# Patient Record
Sex: Male | Born: 1965 | Race: White | Hispanic: No | Marital: Married | Smoking: Never smoker
Health system: Southern US, Community
[De-identification: ages and names within clinical notes are randomized; demographics above are authoritative.]

## PROBLEM LIST (undated history)

## (undated) DIAGNOSIS — K219 Gastro-esophageal reflux disease without esophagitis: Secondary | ICD-10-CM

## (undated) DIAGNOSIS — E782 Mixed hyperlipidemia: Secondary | ICD-10-CM

## (undated) DIAGNOSIS — R7303 Prediabetes: Secondary | ICD-10-CM

## (undated) DIAGNOSIS — M1A9XX0 Chronic gout, unspecified, without tophus (tophi): Secondary | ICD-10-CM

## (undated) DIAGNOSIS — I10 Essential (primary) hypertension: Secondary | ICD-10-CM

## (undated) DIAGNOSIS — M1A00X0 Idiopathic chronic gout, unspecified site, without tophus (tophi): Secondary | ICD-10-CM

## (undated) DIAGNOSIS — J40 Bronchitis, not specified as acute or chronic: Secondary | ICD-10-CM

## (undated) DIAGNOSIS — M109 Gout, unspecified: Secondary | ICD-10-CM

## (undated) DIAGNOSIS — R739 Hyperglycemia, unspecified: Secondary | ICD-10-CM

## (undated) HISTORY — DX: Gout, unspecified: M10.9

## (undated) HISTORY — DX: Chronic gout, unspecified, without tophus (tophi): M1A.9XX0

## (undated) HISTORY — DX: Hyperglycemia, unspecified: R73.9

## (undated) HISTORY — PX: APPENDECTOMY: SHX54

## (undated) HISTORY — DX: Essential (primary) hypertension: I10

## (undated) HISTORY — DX: Mixed hyperlipidemia: E78.2

## (undated) HISTORY — DX: Idiopathic chronic gout, unspecified site, without tophus (tophi): M1A.00X0

## (undated) HISTORY — PX: KNEE ARTHROSCOPY: SUR90

---

## 1994-05-18 HISTORY — PX: KNEE ARTHROSCOPY: SHX127

## 1996-05-18 HISTORY — PX: KNEE ARTHROSCOPY: SUR90

## 2005-05-18 HISTORY — PX: APPENDECTOMY: SHX54

## 2016-04-29 DIAGNOSIS — M1A00X0 Idiopathic chronic gout, unspecified site, without tophus (tophi): Secondary | ICD-10-CM | POA: Insufficient documentation

## 2016-04-29 DIAGNOSIS — M1A9XX0 Chronic gout, unspecified, without tophus (tophi): Secondary | ICD-10-CM | POA: Insufficient documentation

## 2016-05-18 DIAGNOSIS — K852 Alcohol induced acute pancreatitis without necrosis or infection: Secondary | ICD-10-CM

## 2016-05-18 HISTORY — DX: Alcohol induced acute pancreatitis without necrosis or infection: K85.20

## 2016-09-28 ENCOUNTER — Inpatient Hospital Stay: Payer: BLUE CROSS/BLUE SHIELD

## 2016-09-28 ENCOUNTER — Encounter: Payer: Self-pay | Admitting: Oncology

## 2016-09-28 ENCOUNTER — Inpatient Hospital Stay: Payer: BLUE CROSS/BLUE SHIELD | Attending: Oncology | Admitting: Oncology

## 2016-09-28 VITALS — BP 117/77 | HR 94 | Temp 99.2°F | Resp 18 | Ht 67.0 in | Wt 235.9 lb

## 2016-09-28 DIAGNOSIS — E785 Hyperlipidemia, unspecified: Secondary | ICD-10-CM | POA: Diagnosis not present

## 2016-09-28 DIAGNOSIS — Z79899 Other long term (current) drug therapy: Secondary | ICD-10-CM

## 2016-09-28 DIAGNOSIS — R5383 Other fatigue: Secondary | ICD-10-CM | POA: Diagnosis not present

## 2016-09-28 DIAGNOSIS — F17290 Nicotine dependence, other tobacco product, uncomplicated: Secondary | ICD-10-CM | POA: Insufficient documentation

## 2016-09-28 DIAGNOSIS — D691 Qualitative platelet defects: Secondary | ICD-10-CM | POA: Diagnosis present

## 2016-09-28 DIAGNOSIS — E782 Mixed hyperlipidemia: Secondary | ICD-10-CM | POA: Insufficient documentation

## 2016-09-28 DIAGNOSIS — M25562 Pain in left knee: Secondary | ICD-10-CM

## 2016-09-28 DIAGNOSIS — Z803 Family history of malignant neoplasm of breast: Secondary | ICD-10-CM | POA: Diagnosis not present

## 2016-09-28 DIAGNOSIS — M109 Gout, unspecified: Secondary | ICD-10-CM | POA: Insufficient documentation

## 2016-09-28 DIAGNOSIS — G8929 Other chronic pain: Secondary | ICD-10-CM | POA: Insufficient documentation

## 2016-09-28 DIAGNOSIS — I10 Essential (primary) hypertension: Secondary | ICD-10-CM | POA: Diagnosis not present

## 2016-09-28 DIAGNOSIS — Z7984 Long term (current) use of oral hypoglycemic drugs: Secondary | ICD-10-CM

## 2016-09-28 DIAGNOSIS — R7989 Other specified abnormal findings of blood chemistry: Secondary | ICD-10-CM

## 2016-09-28 DIAGNOSIS — Z808 Family history of malignant neoplasm of other organs or systems: Secondary | ICD-10-CM | POA: Diagnosis not present

## 2016-09-28 LAB — CBC WITH DIFFERENTIAL/PLATELET
BASOS ABS: 0.1 10*3/uL (ref 0–0.1)
Basophils Relative: 1 %
Eosinophils Absolute: 0.1 10*3/uL (ref 0–0.7)
Eosinophils Relative: 1 %
HEMATOCRIT: 39.7 % — AB (ref 40.0–52.0)
HEMOGLOBIN: 13.6 g/dL (ref 13.0–18.0)
LYMPHS ABS: 1.4 10*3/uL (ref 1.0–3.6)
LYMPHS PCT: 14 %
MCH: 31.7 pg (ref 26.0–34.0)
MCHC: 34.2 g/dL (ref 32.0–36.0)
MCV: 92.6 fL (ref 80.0–100.0)
MONOS PCT: 9 %
Monocytes Absolute: 0.9 10*3/uL (ref 0.2–1.0)
NEUTROS PCT: 75 %
Neutro Abs: 7.6 10*3/uL — ABNORMAL HIGH (ref 1.4–6.5)
Platelets: 203 10*3/uL (ref 150–440)
RBC: 4.29 MIL/uL — AB (ref 4.40–5.90)
RDW: 12.9 % (ref 11.5–14.5)
WBC: 10 10*3/uL (ref 3.8–10.6)

## 2016-09-28 NOTE — Progress Notes (Signed)
Hematology/Oncology Consult note [facility] Telephone:([phone] [phone]  Patient Care Team: Leim FabryAldridge, Barbara, MD as PCP - General (Family Medicine)   Name of the patient: Austin Wilson  [id number]  04-20-1966    Reason for referral- giant platelets noted on peripheral smear   Referring physician- Dr. Leim FabryBarbara Aldridge  Date of visit: 09/28/16   History of presenting illness- Patient is a 51 yr old male with PMH significant for htn and gout. He has been referred to us for "large and giant platelets" noted on his peripheral smear. Recent CBC from 09/03/2016 revealed white count of 8.3, H&H of 13.6/38.9 with an MCV of 91 and a platelet count of 209. Peripheral blood smear review revealed large and giant platelets as well as tear drop cells. He was also noted to have variant lymphocytes of 7%. Of note his prior CBC from 2014 until present has always noted to have a normal platelet count between 174 246. He is also had a normal white count and a normal H&H in the past. Differential has shown monocytosis and metamyelocytes in the past as well  Reports feeling fatigued. Denies any appetitie or weight loss. Has chronic left knee pain. Has had prior appendectomy and arthroscopic procedures without significant bleeding. No family h/o blood disorders  ECOG PS- 0  Pain scale-4   Review of systems- Review of Systems  Constitutional: Positive for malaise/fatigue. Negative for chills, fever and weight loss.  HENT: Negative for congestion, ear discharge and nosebleeds.   Eyes: Negative for blurred vision.  Respiratory: Negative for cough, hemoptysis, sputum production, shortness of breath and wheezing.   Cardiovascular: Negative for chest pain, palpitations, orthopnea and claudication.  Gastrointestinal: Negative for abdominal pain, blood in stool, constipation, diarrhea, heartburn, melena, nausea and vomiting.  Genitourinary: Negative for dysuria, flank  pain, frequency, hematuria and urgency.  Musculoskeletal: Positive for joint pain. Negative for back pain and myalgias.  Skin: Negative for rash.  Neurological: Negative for dizziness, tingling, focal weakness, seizures, weakness and headaches.  Endo/Heme/Allergies: Does not bruise/bleed easily.  Psychiatric/Behavioral: Negative for depression and suicidal ideas. The patient does not have insomnia.     Allergies  Allergen Reactions  . Pravastatin Other (See Comments)    Stomach pains    Patient Active Problem List   Diagnosis Date Noted  . Essential hypertension 09/28/2016  . Mixed hyperlipidemia 09/28/2016  . Chronic gouty arthritis 04/29/2016     Past Medical History:  Diagnosis Date  . Chronic gouty arthritis   . Gout   . Hyperglycemia   . Hypertension   . Mixed hyperlipidemia      Past Surgical History:  Procedure Laterality Date  . APPENDECTOMY    . KNEE ARTHROSCOPY     x 2    Social History   Social History  . Marital status: Married    Spouse name: N/A  . Number of children: N/A  . Years of education: N/A   Occupational History  . Not on file.   Social History Main Topics  . Smoking status: Never Smoker  . Smokeless tobacco: Current User    Types: Snuff  . Alcohol use 1.2 oz/week    2 Glasses of wine per week     Comment: every day wine  . Drug use: No  . Sexual activity: Not on file   Other Topics Concern  . Not on file   Social History Narrative  . No narrative on file     Family History  Problem  Relation Age of Onset  . Breast cancer Mother   . Brain cancer Paternal Grandmother      Current Outpatient Prescriptions:  .  allopurinol (ZYLOPRIM) 300 MG tablet, Take by mouth., Disp: , Rfl:  .  atorvastatin (LIPITOR) 40 MG tablet, Take by mouth., Disp: , Rfl:  .  colchicine 0.6 MG tablet, 1 tablet daily along with allopurinol for 30 days, Disp: , Rfl:  .  indomethacin (INDOCIN) 50 MG capsule, TAKE ONE CAPSULE (50 MG TOTAL) BY MOUTH  TWICE DAILY AS NEEDED (DO NOT TAKE WITH ALEVE), Disp: , Rfl:  .  lisinopril (PRINIVIL,ZESTRIL) 20 MG tablet, One po qhs, Disp: , Rfl:  .  lisinopril-hydrochlorothiazide (PRINZIDE,ZESTORETIC) 20-12.5 MG tablet, One po qd in the morning, Disp: , Rfl:  .  metFORMIN (GLUCOPHAGE-XR) 500 MG 24 hr tablet, Take by mouth., Disp: , Rfl:  .  verapamil (VERELAN PM) 240 MG 24 hr capsule, Take by mouth., Disp: , Rfl:  .  naproxen sodium (ANAPROX) 220 MG tablet, Take by mouth., Disp: , Rfl:  .  Omega-3 Fatty Acids (FISH OIL PO), Take by mouth., Disp: , Rfl:    Physical exam:  Vitals:   09/28/16 1349  BP: 117/77  Pulse: 94  Resp: 18  Temp: 99.2 F (37.3 C)  TempSrc: Tympanic  Weight: 235 lb 14.3 oz (107 kg)  Height: 5\' 7"  (1.702 m)   Physical Exam  Constitutional: He is oriented to person, place, and time and well-developed, well-nourished, and in no distress.  HENT:  Head: Normocephalic and atraumatic.  Eyes: EOM are normal. Pupils are equal, round, and reactive to light.  Neck: Normal range of motion.  Cardiovascular: Normal rate, regular rhythm and normal heart sounds.   Pulmonary/Chest: Effort normal and breath sounds normal.  Abdominal: Soft. Bowel sounds are normal.  Neurological: He is alert and oriented to person, place, and time.  Skin: Skin is warm and dry.        Assessment and plan- Patient is a 51 y.o. male referred to [place] for giant platelets noted on peripheral smear with a normal platelet count  On review of his cbc- patient has had giant platelets which is seen when there is increased turnover of platelets in conditions such as ITP with (which the patient does not have given normal platelet count), myeloproliferative disorders (unlikely given normal wbc and H/H) but needs to be ruled out and congenital platelet disorders. Patient has had prior surgeries with no bleeding and in the presence of normal platelet count, this is unlikely.  Given that patient had some left shift on  his differential including monicytosis/ variant lymphocytes in additon to giant platelets- I will obtain cbc with diff, pathology review of his smear and peripheral flow cytometry and bcr abl testing. I will see him back in 3 weeks time to discuss results fo his bloodwork   Thank you for this kind referral and the opportunity to participate in the care of this  Patient   Visit Diagnosis 1. Abnormal morphology of platelets     Dr. Owens Shark, MD, MPH [facility] Pager- [phone] 09/28/2016

## 2016-09-29 LAB — PATHOLOGIST SMEAR REVIEW

## 2016-10-01 ENCOUNTER — Encounter: Payer: Self-pay | Admitting: Family Medicine

## 2016-10-01 LAB — BCR-ABL1, CML/ALL, PCR, QUANT

## 2016-10-05 LAB — COMP PANEL: LEUKEMIA/LYMPHOMA: CLINICAL INFO: 14

## 2016-10-19 ENCOUNTER — Ambulatory Visit: Payer: BLUE CROSS/BLUE SHIELD | Admitting: Oncology

## 2016-10-26 ENCOUNTER — Inpatient Hospital Stay: Payer: BLUE CROSS/BLUE SHIELD | Attending: Oncology | Admitting: Oncology

## 2016-10-26 ENCOUNTER — Encounter: Payer: Self-pay | Admitting: Oncology

## 2016-10-26 VITALS — BP 127/88 | HR 84 | Temp 98.6°F | Resp 18 | Wt 239.5 lb

## 2016-10-26 DIAGNOSIS — I10 Essential (primary) hypertension: Secondary | ICD-10-CM | POA: Diagnosis not present

## 2016-10-26 DIAGNOSIS — E785 Hyperlipidemia, unspecified: Secondary | ICD-10-CM | POA: Insufficient documentation

## 2016-10-26 DIAGNOSIS — Z7984 Long term (current) use of oral hypoglycemic drugs: Secondary | ICD-10-CM | POA: Diagnosis not present

## 2016-10-26 DIAGNOSIS — M25562 Pain in left knee: Secondary | ICD-10-CM | POA: Diagnosis not present

## 2016-10-26 DIAGNOSIS — F17290 Nicotine dependence, other tobacco product, uncomplicated: Secondary | ICD-10-CM | POA: Insufficient documentation

## 2016-10-26 DIAGNOSIS — M109 Gout, unspecified: Secondary | ICD-10-CM | POA: Diagnosis not present

## 2016-10-26 DIAGNOSIS — Z79899 Other long term (current) drug therapy: Secondary | ICD-10-CM | POA: Diagnosis not present

## 2016-10-26 DIAGNOSIS — R799 Abnormal finding of blood chemistry, unspecified: Secondary | ICD-10-CM | POA: Diagnosis not present

## 2016-10-26 DIAGNOSIS — G8929 Other chronic pain: Secondary | ICD-10-CM | POA: Insufficient documentation

## 2016-10-26 NOTE — Progress Notes (Signed)
Hematology/Oncology Consult note [facility]  Telephone:([phone] Fax:([phone]  Patient Care Team: Gayland Curry, MD as PCP - General (Family Medicine)   Name of the patient: Austin Wilson  [id number]  [birth year]   Date of visit: 10/26/16  Diagnosis- giant platelets on peripheral smear.   Chief complaint/ Reason for visit- discuss results of bloodwork  Heme/Onc history: Patient is a 51 yr old male with PMH significant for htn and gout. He has been referred to [place] for "large and giant platelets" noted on his peripheral smear. Recent CBC from 09/03/2016 revealed white count of 8.3, H&H of 13.6/38.9 with an MCV of 91 and a platelet count of 209. Peripheral blood smear review revealed large and giant platelets as well as tear drop cells. He was also noted to have variant lymphocytes of 7%. Of note his prior CBC from 2014 until present has always noted to have a normal platelet count between 174 246. He is also had a normal white count and a normal H&H in the past. Differential has shown monocytosis and metamyelocytes in the past as well  Reports feeling fatigued. Denies any appetitie or weight loss. Has chronic left knee pain. Has had prior appendectomy and arthroscopic procedures without significant bleeding. No family h/o blood disorders   Further blood work from 09/28/2016 was as follows: CBC showed white count of 10.0, H&H of 13.6/29.7 and a platelet count of 203. Review of peripheral blood smear showed normal platelet morphology and rare giant platelets. Unremarkable erythroid platelets and WBC morphology. Peripheral cytometry did not reveal any evidence of lymphoma or leukemia. BCR able testing was negative for CML.  Interval history- has chronic left knee pain in need of replacement. Denies other complaints    Review of systems- Review of Systems  Constitutional: Negative for chills, fever, malaise/fatigue and weight loss.  HENT:  Negative for congestion, ear discharge and nosebleeds.   Eyes: Negative for blurred vision.  Respiratory: Negative for cough, hemoptysis, sputum production, shortness of breath and wheezing.   Cardiovascular: Negative for chest pain, palpitations, orthopnea and claudication.  Gastrointestinal: Negative for abdominal pain, blood in stool, constipation, diarrhea, heartburn, melena, nausea and vomiting.  Genitourinary: Negative for dysuria, flank pain, frequency, hematuria and urgency.  Musculoskeletal: Positive for joint pain. Negative for back pain and myalgias.  Skin: Negative for rash.  Neurological: Negative for dizziness, tingling, focal weakness, seizures, weakness and headaches.  Endo/Heme/Allergies: Does not bruise/bleed easily.  Psychiatric/Behavioral: Negative for depression and suicidal ideas. The patient does not have insomnia.        Allergies  Allergen Reactions  . Pravastatin Other (See Comments)    Stomach pains     Past Medical History:  Diagnosis Date  . Chronic gouty arthritis   . Gout   . Hyperglycemia   . Hypertension   . Mixed hyperlipidemia      Past Surgical History:  Procedure Laterality Date  . APPENDECTOMY    . KNEE ARTHROSCOPY     x 2    Social History   Social History  . Marital status: Married    Spouse name: N/A  . Number of children: N/A  . Years of education: N/A   Occupational History  . Not on file.   Social History Main Topics  . Smoking status: Never Smoker  . Smokeless tobacco: Current User    Types: Snuff  . Alcohol use 1.2 oz/week    2 Glasses of wine per week     Comment:  every day wine  . Drug use: No  . Sexual activity: Not on file   Other Topics Concern  . Not on file   Social History Narrative  . No narrative on file    Family History  Problem Relation Age of Onset  . Breast cancer Mother   . Brain cancer Paternal Grandmother      Current Outpatient Prescriptions:  .  allopurinol (ZYLOPRIM) 300 MG  tablet, Take by mouth., Disp: , Rfl:  .  atorvastatin (LIPITOR) 40 MG tablet, Take by mouth., Disp: , Rfl:  .  colchicine 0.6 MG tablet, 1 tablet daily along with allopurinol for 30 days, Disp: , Rfl:  .  indomethacin (INDOCIN) 50 MG capsule, TAKE ONE CAPSULE (50 MG TOTAL) BY MOUTH TWICE DAILY AS NEEDED (DO NOT TAKE WITH ALEVE), Disp: , Rfl:  .  lisinopril (PRINIVIL,ZESTRIL) 20 MG tablet, One po qhs, Disp: , Rfl:  .  lisinopril-hydrochlorothiazide (PRINZIDE,ZESTORETIC) 20-12.5 MG tablet, One po qd in the morning, Disp: , Rfl:  .  metFORMIN (GLUCOPHAGE-XR) 500 MG 24 hr tablet, Take by mouth., Disp: , Rfl:  .  naproxen sodium (ANAPROX) 220 MG tablet, Take by mouth., Disp: , Rfl:  .  Omega-3 Fatty Acids (FISH OIL PO), Take by mouth., Disp: , Rfl:  .  verapamil (VERELAN PM) 240 MG 24 hr capsule, Take by mouth., Disp: , Rfl:   Physical exam:  Vitals:   10/26/16 1442  BP: 127/88  Pulse: 84  Resp: 18  Temp: 98.6 F (37 C)  TempSrc: Tympanic  Weight: 239 lb 8.5 oz (108.7 kg)   Physical Exam  Constitutional: He is oriented to person, place, and time and well-developed, well-nourished, and in no distress.  HENT:  Head: Normocephalic and atraumatic.  Eyes: EOM are normal. Pupils are equal, round, and reactive to light.  Neck: Normal range of motion.  Cardiovascular: Normal rate, regular rhythm and normal heart sounds.   Pulmonary/Chest: Effort normal and breath sounds normal.  Abdominal: Soft. Bowel sounds are normal.  Neurological: He is alert and oriented to person, place, and time.  Skin: Skin is warm and dry.     No flowsheet data found. CBC Latest Ref Rng & Units 09/28/2016  WBC 3.8 - 10.6 K/uL 10.0  Hemoglobin 13.0 - 18.0 g/dL 13.6  Hematocrit 40.0 - 52.0 % 39.7(L)  Platelets 150 - 440 K/uL 203      Assessment and plan- Patient is a 51 y.o. male referred for abnormal peripheral smear  I discussed the results of bloodwork with the patient. His wbc, H/H and platelet counts are  normal. Peripheral smear showed rarae giant platelets which is not necessarily abnormal. Flow cytometry and bcr abl testing negative. This can be monitored conservatively without the need for bone marrow biopsy at this time  Repeat cbc in 6 montsh and I will see him in 1 year with cbc/diff    Visit Diagnosis 1. Abnormal blood smear      Dr. Randa Evens, MD, MPH [facility] Pager- [phone] 10/26/2016 2:37 PM

## 2016-10-26 NOTE — Progress Notes (Signed)
Here for follow up. Saw rheumatologist recently  -no info per pt. Went r/t gout issues.

## 2016-12-04 ENCOUNTER — Other Ambulatory Visit: Payer: Self-pay | Admitting: Orthopedic Surgery

## 2016-12-04 DIAGNOSIS — G8929 Other chronic pain: Secondary | ICD-10-CM

## 2016-12-04 DIAGNOSIS — M25562 Pain in left knee: Principal | ICD-10-CM

## 2016-12-04 DIAGNOSIS — M2352 Chronic instability of knee, left knee: Secondary | ICD-10-CM

## 2016-12-04 DIAGNOSIS — M1712 Unilateral primary osteoarthritis, left knee: Secondary | ICD-10-CM

## 2016-12-09 ENCOUNTER — Ambulatory Visit: Payer: BLUE CROSS/BLUE SHIELD

## 2016-12-16 DIAGNOSIS — E669 Obesity, unspecified: Secondary | ICD-10-CM | POA: Insufficient documentation

## 2016-12-17 ENCOUNTER — Ambulatory Visit: Payer: BLUE CROSS/BLUE SHIELD

## 2017-04-12 DIAGNOSIS — K852 Alcohol induced acute pancreatitis without necrosis or infection: Secondary | ICD-10-CM | POA: Insufficient documentation

## 2017-04-16 DIAGNOSIS — K760 Fatty (change of) liver, not elsewhere classified: Secondary | ICD-10-CM | POA: Insufficient documentation

## 2017-04-26 ENCOUNTER — Other Ambulatory Visit: Payer: BLUE CROSS/BLUE SHIELD

## 2017-08-13 ENCOUNTER — Other Ambulatory Visit: Payer: Self-pay | Admitting: Unknown Physician Specialty

## 2017-08-13 DIAGNOSIS — M1732 Unilateral post-traumatic osteoarthritis, left knee: Secondary | ICD-10-CM

## 2017-08-13 DIAGNOSIS — E669 Obesity, unspecified: Secondary | ICD-10-CM

## 2017-08-21 ENCOUNTER — Ambulatory Visit: Payer: BLUE CROSS/BLUE SHIELD

## 2017-09-22 DIAGNOSIS — M1712 Unilateral primary osteoarthritis, left knee: Secondary | ICD-10-CM | POA: Insufficient documentation

## 2017-10-04 HISTORY — PX: TOTAL KNEE ARTHROPLASTY: SHX125

## 2017-10-20 ENCOUNTER — Telehealth: Payer: Self-pay | Admitting: *Deleted

## 2017-10-20 NOTE — Telephone Encounter (Signed)
Patient no longer needs appts according to message I received in in basket message from [place].

## 2017-10-20 NOTE — Telephone Encounter (Signed)
-----   Message from Lauretta Grilliana H Toth sent at 10/20/2017 11:39 AM EDT ----- Regarding: cancel appt Pt called and didnt remember appt this June for 1 yr f/up-says to cancel his issue has been resolved. I did CANCEL his appts

## 2017-10-25 ENCOUNTER — Ambulatory Visit: Payer: BLUE CROSS/BLUE SHIELD | Admitting: Oncology

## 2017-10-25 ENCOUNTER — Other Ambulatory Visit: Payer: BLUE CROSS/BLUE SHIELD

## 2018-04-16 DIAGNOSIS — G4733 Obstructive sleep apnea (adult) (pediatric): Secondary | ICD-10-CM | POA: Insufficient documentation

## 2020-09-06 DIAGNOSIS — F5101 Primary insomnia: Secondary | ICD-10-CM | POA: Insufficient documentation

## 2020-10-24 ENCOUNTER — Other Ambulatory Visit: Payer: Self-pay

## 2020-10-24 ENCOUNTER — Ambulatory Visit: Payer: BC Managed Care – PPO | Admitting: Urology

## 2020-10-24 ENCOUNTER — Encounter: Payer: Self-pay | Admitting: Urology

## 2020-10-24 VITALS — BP 174/98 | HR 82 | Ht 67.0 in | Wt 250.0 lb

## 2020-10-24 DIAGNOSIS — E291 Testicular hypofunction: Secondary | ICD-10-CM | POA: Diagnosis not present

## 2020-10-24 DIAGNOSIS — Z125 Encounter for screening for malignant neoplasm of prostate: Secondary | ICD-10-CM

## 2020-10-24 NOTE — Progress Notes (Signed)
10/24/2020 8:17 AM   Austin Wilson 01-Oct-1965 [id number]  Referring provider: Lovell Sheehan, MD [address] [place],  [place] [address]  Chief Complaint  Patient presents with   Hypogonadism    HPI: Austin Wilson is a 55 y.o. male referred for evaluation of hypogonadism.  Complains of significant tiredness/fatigue and low libido Denies erectile dysfunction No prior urologic history No bothersome LUTS Total testosterone level 09/06/2020 was 200 ng/dL and a repeat testosterone level 10/02/2020 was 202 ng/dL PSA 3/66/4403 was 4.74   PMH: Past Medical History:  Diagnosis Date   Chronic gouty arthritis    Gout    Hyperglycemia    Hypertension    Mixed hyperlipidemia     Surgical History: Past Surgical History:  Procedure Laterality Date   APPENDECTOMY     KNEE ARTHROSCOPY     x 2    Home Medications:  Allergies as of 10/24/2020       Reactions   Pravastatin Other (See Comments)   Stomach pains        Medication List        Accurate as of October 24, 2020  8:17 AM. If you have any questions, ask your nurse or doctor.          STOP taking these medications    allopurinol 300 MG tablet Commonly known as: ZYLOPRIM Stopped by: Riki Altes, MD       TAKE these medications    atorvastatin 80 MG tablet Commonly known as: LIPITOR Take 1 tablet by mouth daily. What changed: Another medication with the same name was removed. Continue taking this medication, and follow the directions you see here. Changed by: Riki Altes, MD   colchicine 0.6 MG tablet 1 tablet daily along with allopurinol for 30 days   FISH OIL PO Take by mouth.   indomethacin 50 MG capsule Commonly known as: INDOCIN TAKE ONE CAPSULE (50 MG TOTAL) BY MOUTH TWICE DAILY AS NEEDED (DO NOT TAKE WITH ALEVE)   lisinopril 20 MG tablet Commonly known as: ZESTRIL One po qhs   lisinopril-hydrochlorothiazide 20-12.5 MG tablet Commonly known as: ZESTORETIC One po  qd in the morning   metFORMIN 500 MG 24 hr tablet Commonly known as: GLUCOPHAGE-XR Take by mouth.   naproxen sodium 220 MG tablet Commonly known as: ALEVE Take by mouth.   verapamil 240 MG 24 hr capsule Commonly known as: VERELAN PM Take by mouth.        Allergies:  Allergies  Allergen Reactions   Pravastatin Other (See Comments)    Stomach pains    Family History: Family History  Problem Relation Age of Onset   Breast cancer Mother    Brain cancer Paternal Grandmother     Social History:  reports that he has never smoked. His smokeless tobacco use includes snuff. He reports current alcohol use of about 2.0 standard drinks of alcohol per week. He reports that he does not use drugs.   Physical Exam: BP (!) 174/98   Pulse 82   Ht 5\' 7"  (1.702 m)   Wt 250 lb (113.4 kg)   BMI 39.16 kg/m   Constitutional:  Alert and oriented, No acute distress. HEENT: [place] AT, moist mucus membranes.  Trachea midline, no masses. Cardiovascular: No clubbing, cyanosis, or edema. Respiratory: Normal respiratory effort, no increased work of breathing. GI: Abdomen is soft, nontender, nondistended, no abdominal masses GU: Phallus is without lesions, testes descended bilaterally with estimated volume 20 cc bilaterally.  Spermatic cord/epididymis  palpably normal bilaterally Skin: No rashes, bruises or suspicious lesions. Neurologic: Grossly intact, no focal deficits, moving all 4 extremities. Psychiatric: Normal mood and affect.   Assessment & Plan:    1.  Hypogonadism Symptoms of tiredness, fatigue and decreased libido Potential side effects of testosterone replacement were discussed including stimulation of benign prostatic growth with lower urinary tract symptoms; erythrocytosis; edema; gynecomastia; worsening sleep apnea; venous thromboembolism; testicular atrophy and infertility. Recent studies suggesting an increased incidence of heart attack and stroke in patients taking testosterone  was discussed. He was informed there is conflicting evidence regarding the impact of testosterone therapy on cardiovascular risk. The theoretical risk of growth stimulation of an undetected prostate cancer was also discussed.  He was informed that current evidence does not provide any definitive answers regarding the risks of testosterone therapy on prostate cancer and cardiovascular disease. The need for periodic monitoring of his testosterone level, PSA, hematocrit and DRE was discussed. We discussed the most common TRT therapies including intramuscular injections and topical gels.  He desires injections He has had 2 abnormal testosterone levels however need to check LH, prolactin.  Last PSA 06/2019 and PSA was also ordered   Riki Altes, MD  [facility] [address] [place], [place] [address] [phone]

## 2020-10-25 ENCOUNTER — Telehealth: Payer: Self-pay | Admitting: Urology

## 2020-10-25 ENCOUNTER — Telehealth: Payer: Self-pay | Admitting: Family Medicine

## 2020-10-25 LAB — PROLACTIN: Prolactin: 4.7 ng/mL (ref 4.0–15.2)

## 2020-10-25 LAB — PSA: Prostate Specific Ag, Serum: 0.3 ng/mL (ref 0.0–4.0)

## 2020-10-25 LAB — LUTEINIZING HORMONE: LH: 4.1 m[IU]/mL (ref 1.7–8.6)

## 2020-10-25 MED ORDER — TESTOSTERONE CYPIONATE 200 MG/ML IM SOLN: 200.0000 mg | INTRAMUSCULAR | 0 refills | Status: DC

## 2020-10-25 NOTE — Telephone Encounter (Signed)
Patient notified and will call our office for injection teaching when he has the medication in hand.

## 2020-10-25 NOTE — Telephone Encounter (Signed)
Rx testosterone sent.  Please schedule follow-up office visit with testosterone prior 6 weeks after his initial injection

## 2020-10-25 NOTE — Telephone Encounter (Signed)
-----   Message from Riki Altes, MD sent at 10/25/2020  2:40 PM EDT ----- LH, prolactin and PSA levels were all normal.  He indicated yesterday he would like to start injections as his form of TRT.  We will send in Rx testosterone to his pharmacy and he needs an appointment for injection training.

## 2020-10-30 ENCOUNTER — Telehealth: Payer: Self-pay | Admitting: Urology

## 2020-10-30 NOTE — Telephone Encounter (Signed)
Pt made injection training appt, pt did not get syringes, please send Rx for syringes

## 2020-10-31 ENCOUNTER — Other Ambulatory Visit: Payer: Self-pay | Admitting: *Deleted

## 2020-10-31 MED ORDER — "BD SAFETYGLIDE NEEDLE 18G X 1-1/2"" MISC": 0 refills | Status: DC

## 2020-10-31 MED ORDER — BD LUER-LOK SYRINGE 21G X 1-1/2" 3 ML MISC: 0 refills | Status: DC

## 2020-11-07 ENCOUNTER — Ambulatory Visit: Payer: BC Managed Care – PPO | Admitting: Urology

## 2020-11-12 NOTE — Progress Notes (Signed)
Austin Wilson is a 55 y.o.  male with testosterone deficiency who presents today for instruction on delivering an IM injection of testosterone cypionate.    I instructed the patient to identify the concentration of his testosterone. Testosterone for injection is usually in the form of testosterone cypionate. These liquids come in multiple concentrations, so before giving an injection, it's very important to make sure that his intended dosage takes into account the concentration of the testosterone serum. Usually, testosterone comes in a concentration of either 100 mg/ml or 200 mg/ml.  We typically use the 200 mg/mL in this office.    Using a sterile, 18 G needle and 3 cc syringe, the testosterone cypionate (Lot # [id number], exp. 02/2023 was drawn up for a 1 cc injection.  To draw up the dose, I demonstrated how to first draw air into the syringe equal to the volume of the dosage. Then, wipe the top of the medication bottle with an alcohol wipe, insert the needle through the lid and into the medication, and push the air from your syringe into the bottle. Turn the bottle upside down and draw out the exact dosage of testosterone.  I demonstrated how to aspirate the syringe by hinge the syringe with its needle uncapped and pointing up in front of him.  Looking for air bubbles in the syringe. Flick the side of the syringe to get these bubbles to rise to the top.    When the dosage is bubble-free, I slowly depressed the plunger to force the air at the top of the syringe out stopping when a tiny drop of medication comes out of the tip of the syringe.  I advised him to be certain no air remained in the syringe as injecting air is very dangerous.  Being careful not to squirt or spray a significant portion of the dosage onto the floor.  Preparing the injection site, outer middle third of the vastus lateralis muscle of the right thigh, I took a sterile alcohol pad and wipe the immediate area around where I  intended him to inject.   I then demonstrated how to change the needle from the 18 G to the 21 G needle.  I then gave him the syringe for injecting.  He correctly identified the injection location.  He held the syringe like a dart at a 90-degree angle above the sterile injection site. Quickly plunged it into the flesh. Before depressing the plunger, he drew back on it slightly and no blood was seen.  I advised him that if blood flashed in the syringe, he would need to remove the needle and then select a different location as he was in the vein.  Inject the medication at a steady, controlled pace.  He fully depressed the plunger, slowly pull the needle out. Pressing around the injection site with a sterile cotton swab as he did so - this preventing the emerging needle from pulling on the skin and causing extra pain.  We assessed the needle entry point for bleeding, and applied a sterile Band-Aid and/or cotton swab if needed. Disposed of the used needle and syringe in a proper sharps container.  I advised him to acquire a sharps container for his personal use at home.  I advised him that If, after injection, he experienced redness, swelling, or discomfort beyond that of normal soreness at the site of injection, call our office for an appointment and instructions.  He is to always store his medication at the recommended temperature, and always  check the expiration date on the bottle. If it's expired, don't use it.  Of course, keep all of med's out of reach of children.  Do not change his dose without consulting your provider.  His starting dose will be 1 cc every 2 weeks.  He will return in 6 weeks for a testosterone level.  Explained it is very important to keep laboratory appointments and follow-up appointments as this medication has a tendency to increase hematocrit and hemoglobin which puts an individual at risk for stroke and heart attack.  If he is not compliant with his appointments, we may need to  discontinue his testosterone therapy.  Octavian Godek, PA-C  I spent 15 minutes on the day of the encounter to include pre-visit record review, face-to-face time with the patient, and post-visit ordering of tests.

## 2020-11-13 ENCOUNTER — Other Ambulatory Visit: Payer: Self-pay

## 2020-11-13 ENCOUNTER — Encounter: Payer: Self-pay | Admitting: Urology

## 2020-11-13 ENCOUNTER — Ambulatory Visit: Payer: BC Managed Care – PPO | Admitting: Urology

## 2020-11-13 VITALS — BP 171/83 | HR 84 | Ht 67.0 in | Wt 250.0 lb

## 2020-11-13 DIAGNOSIS — E349 Endocrine disorder, unspecified: Secondary | ICD-10-CM | POA: Diagnosis not present

## 2020-11-13 MED ORDER — TESTOSTERONE CYPIONATE 200 MG/ML IM SOLN: 200.0000 mg | INTRAMUSCULAR | 0 refills | Status: DC

## 2020-12-24 ENCOUNTER — Other Ambulatory Visit: Payer: Self-pay | Admitting: Orthopedic Surgery

## 2020-12-24 DIAGNOSIS — G8929 Other chronic pain: Secondary | ICD-10-CM

## 2020-12-24 DIAGNOSIS — M2391 Unspecified internal derangement of right knee: Secondary | ICD-10-CM

## 2020-12-24 DIAGNOSIS — Z9889 Other specified postprocedural states: Secondary | ICD-10-CM

## 2020-12-24 DIAGNOSIS — S8391XA Sprain of unspecified site of right knee, initial encounter: Secondary | ICD-10-CM

## 2020-12-27 ENCOUNTER — Other Ambulatory Visit: Payer: BC Managed Care – PPO

## 2020-12-27 ENCOUNTER — Other Ambulatory Visit: Payer: Self-pay

## 2020-12-27 DIAGNOSIS — E349 Endocrine disorder, unspecified: Secondary | ICD-10-CM

## 2020-12-28 LAB — TESTOSTERONE: Testosterone: 335 ng/dL (ref 264–916)

## 2021-01-02 DIAGNOSIS — E291 Testicular hypofunction: Secondary | ICD-10-CM

## 2021-01-02 DIAGNOSIS — E349 Endocrine disorder, unspecified: Secondary | ICD-10-CM

## 2021-01-02 NOTE — Telephone Encounter (Signed)
Would you please get him scheduled for a testosterone, HCT and HBG in four weeks?

## 2021-01-16 ENCOUNTER — Other Ambulatory Visit: Payer: Self-pay

## 2021-01-16 ENCOUNTER — Ambulatory Visit
Admission: RE | Admit: 2021-01-16 | Discharge: 2021-01-16 | Disposition: A | Discharge status: Home / Self Care | Payer: BC Managed Care – PPO | Source: Ambulatory Visit | Attending: Orthopedic Surgery | Admitting: Orthopedic Surgery

## 2021-01-16 DIAGNOSIS — M2391 Unspecified internal derangement of right knee: Secondary | ICD-10-CM

## 2021-01-16 DIAGNOSIS — Z9889 Other specified postprocedural states: Secondary | ICD-10-CM

## 2021-01-16 DIAGNOSIS — G8929 Other chronic pain: Secondary | ICD-10-CM

## 2021-01-16 DIAGNOSIS — S8391XA Sprain of unspecified site of right knee, initial encounter: Secondary | ICD-10-CM

## 2021-01-30 ENCOUNTER — Other Ambulatory Visit: Payer: BC Managed Care – PPO

## 2021-01-30 ENCOUNTER — Other Ambulatory Visit: Payer: Self-pay

## 2021-01-30 DIAGNOSIS — E349 Endocrine disorder, unspecified: Secondary | ICD-10-CM

## 2021-01-30 DIAGNOSIS — E291 Testicular hypofunction: Secondary | ICD-10-CM

## 2021-01-31 ENCOUNTER — Other Ambulatory Visit: Payer: Self-pay | Admitting: *Deleted

## 2021-01-31 DIAGNOSIS — E349 Endocrine disorder, unspecified: Secondary | ICD-10-CM

## 2021-01-31 DIAGNOSIS — E291 Testicular hypofunction: Secondary | ICD-10-CM

## 2021-01-31 LAB — HEMOGLOBIN AND HEMATOCRIT, BLOOD
Hematocrit: 49 % (ref 37.5–51.0)
Hemoglobin: 16.5 g/dL (ref 13.0–17.7)

## 2021-01-31 LAB — TESTOSTERONE: Testosterone: 1316 ng/dL — ABNORMAL HIGH (ref 264–916)

## 2021-02-21 ENCOUNTER — Other Ambulatory Visit: Payer: Self-pay | Admitting: Surgery

## 2021-02-24 ENCOUNTER — Encounter
Admission: RE | Admit: 2021-02-24 | Discharge: 2021-02-24 | Disposition: A | Discharge status: Home / Self Care | Payer: BC Managed Care – PPO | Source: Ambulatory Visit | Attending: Surgery | Admitting: Surgery

## 2021-02-24 ENCOUNTER — Other Ambulatory Visit: Payer: Self-pay

## 2021-02-24 DIAGNOSIS — M109 Gout, unspecified: Secondary | ICD-10-CM | POA: Diagnosis not present

## 2021-02-24 DIAGNOSIS — W010XXA Fall on same level from slipping, tripping and stumbling without subsequent striking against object, initial encounter: Secondary | ICD-10-CM | POA: Diagnosis not present

## 2021-02-24 DIAGNOSIS — E785 Hyperlipidemia, unspecified: Secondary | ICD-10-CM | POA: Diagnosis not present

## 2021-02-24 DIAGNOSIS — M1711 Unilateral primary osteoarthritis, right knee: Secondary | ICD-10-CM | POA: Diagnosis not present

## 2021-02-24 DIAGNOSIS — G4733 Obstructive sleep apnea (adult) (pediatric): Secondary | ICD-10-CM | POA: Diagnosis not present

## 2021-02-24 DIAGNOSIS — I10 Essential (primary) hypertension: Secondary | ICD-10-CM | POA: Diagnosis not present

## 2021-02-24 DIAGNOSIS — Z20822 Contact with and (suspected) exposure to covid-19: Secondary | ICD-10-CM | POA: Diagnosis not present

## 2021-02-24 DIAGNOSIS — Z01818 Encounter for other preprocedural examination: Secondary | ICD-10-CM | POA: Diagnosis present

## 2021-02-24 DIAGNOSIS — Z96652 Presence of left artificial knee joint: Secondary | ICD-10-CM | POA: Diagnosis not present

## 2021-02-24 DIAGNOSIS — S83281A Other tear of lateral meniscus, current injury, right knee, initial encounter: Secondary | ICD-10-CM | POA: Diagnosis not present

## 2021-02-24 DIAGNOSIS — I2510 Atherosclerotic heart disease of native coronary artery without angina pectoris: Secondary | ICD-10-CM | POA: Diagnosis not present

## 2021-02-24 DIAGNOSIS — S83231A Complex tear of medial meniscus, current injury, right knee, initial encounter: Secondary | ICD-10-CM | POA: Diagnosis not present

## 2021-02-24 HISTORY — DX: Bronchitis, not specified as acute or chronic: J40

## 2021-02-24 HISTORY — DX: Prediabetes: R73.03

## 2021-02-24 HISTORY — DX: Gastro-esophageal reflux disease without esophagitis: K21.9

## 2021-02-24 LAB — URINALYSIS, ROUTINE W REFLEX MICROSCOPIC
Bilirubin Urine: NEGATIVE
Glucose, UA: NEGATIVE mg/dL
Hgb urine dipstick: NEGATIVE
Ketones, ur: NEGATIVE mg/dL
Leukocytes,Ua: NEGATIVE
Nitrite: NEGATIVE
Protein, ur: NEGATIVE mg/dL
Specific Gravity, Urine: 1.025 (ref 1.005–1.030)
pH: 5 (ref 5.0–8.0)

## 2021-02-24 LAB — COMPREHENSIVE METABOLIC PANEL
ALT: 28 U/L (ref 0–44)
AST: 24 U/L (ref 15–41)
Albumin: 4.3 g/dL (ref 3.5–5.0)
Alkaline Phosphatase: 61 U/L (ref 38–126)
Anion gap: 8 (ref 5–15)
BUN: 17 mg/dL (ref 6–20)
CO2: 29 mmol/L (ref 22–32)
Calcium: 9.5 mg/dL (ref 8.9–10.3)
Chloride: 99 mmol/L (ref 98–111)
Creatinine, Ser: 0.8 mg/dL (ref 0.61–1.24)
GFR, Estimated: >60 mL/min (ref >60)
Glucose, Bld: 130 mg/dL — ABNORMAL HIGH (ref 70–99)
Potassium: 4 mmol/L (ref 3.5–5.1)
Sodium: 136 mmol/L (ref 135–145)
Total Bilirubin: 0.8 mg/dL (ref 0.3–1.2)
Total Protein: 7.2 g/dL (ref 6.5–8.1)

## 2021-02-24 LAB — CBC WITH DIFFERENTIAL/PLATELET
Abs Immature Granulocytes: 0.05 10*3/uL (ref 0.00–0.07)
Basophils Absolute: 0.1 10*3/uL (ref 0.0–0.1)
Basophils Relative: 1 %
Eosinophils Absolute: 0.1 10*3/uL (ref 0.0–0.5)
Eosinophils Relative: 1 %
HCT: 48 % (ref 39.0–52.0)
Hemoglobin: 16.5 g/dL (ref 13.0–17.0)
Immature Granulocytes: 1 %
Lymphocytes Relative: 17 %
Lymphs Abs: 1.7 10*3/uL (ref 0.7–4.0)
MCH: 32.1 pg (ref 26.0–34.0)
MCHC: 34.4 g/dL (ref 30.0–36.0)
MCV: 93.4 fL (ref 80.0–100.0)
Monocytes Absolute: 1.1 10*3/uL — ABNORMAL HIGH (ref 0.1–1.0)
Monocytes Relative: 11 %
Neutro Abs: 7.2 10*3/uL (ref 1.7–7.7)
Neutrophils Relative %: 69 %
Platelets: 201 10*3/uL (ref 150–400)
RBC: 5.14 MIL/uL (ref 4.22–5.81)
RDW: 11.6 % (ref 11.5–15.5)
WBC: 10.2 10*3/uL (ref 4.0–10.5)
nRBC: 0 % (ref 0.0–0.2)

## 2021-02-24 LAB — TYPE AND SCREEN
ABO/RH(D): O POS
Antibody Screen: NEGATIVE

## 2021-02-24 LAB — SURGICAL PCR SCREEN
MRSA, PCR: NEGATIVE
Staphylococcus aureus: NEGATIVE

## 2021-02-24 NOTE — Patient Instructions (Addendum)
Your procedure is scheduled on: Thursday, October 13 Report to the Registration Desk on the 1st floor of the [facility] To find out your arrival time, please call [phone] between 1PM - 3PM on: Wednesday, October 12  REMEMBER: Instructions that are not followed completely may result in serious medical risk, up to and including death; or upon the discretion of your surgeon and anesthesiologist your surgery may need to be rescheduled.  Do not eat food after midnight the night before surgery.  No gum chewing, lozengers or hard candies.  You may however, drink CLEAR liquids up to 2 hours before you are scheduled to arrive for your surgery. Do not drink anything within 2 hours of your scheduled arrival time.  Clear liquids include: - water  - apple juice without pulp - gatorade (not RED, PURPLE, OR BLUE) - black coffee or tea (Do NOT add milk or creamers to the coffee or tea) Do NOT drink anything that is not on this list.  In addition, your doctor has ordered for you to drink the provided  Ensure Pre-Surgery Clear Carbohydrate Drink  Drinking this carbohydrate drink up to two hours before surgery helps to reduce insulin resistance and improve patient outcomes. Please complete drinking 2 hours prior to scheduled arrival time.  TAKE THESE MEDICATIONS THE MORNING OF SURGERY WITH A SIP OF WATER:  Verapamil  Stop metformin 2 days prior to surgery; last day to take metformin is Monday, October 10. Resume AFTER surgery.  One week prior to surgery: Stop Anti-inflammatories (NSAIDS) such as Advil, Aleve, Ibuprofen, Motrin, Naproxen, Naprosyn and Aspirin based products such as Excedrin, Goodys Powder, BC Powder. Stop ANY OVER THE COUNTER supplements until after surgery. You may however, continue to take Tylenol if needed for pain up until the day of surgery.  No Alcohol for 24 hours before or after surgery.  No Smoking including e-cigarettes for 24 hours prior to surgery.  No chewable  tobacco products for at least 6 hours prior to surgery.  No nicotine patches on the day of surgery.  Do not use any "recreational" drugs for at least a week prior to your surgery.  Please be advised that the combination of cocaine and anesthesia may have negative outcomes, up to and including death. If you test positive for cocaine, your surgery will be cancelled.  On the morning of surgery brush your teeth with toothpaste and water, you may rinse your mouth with mouthwash if you wish. Do not swallow any toothpaste or mouthwash.  Use CHG Soap as directed on instruction sheet.  Do not wear jewelry, make-up, hairpins, clips or nail polish.  Do not wear lotions, powders, or perfumes.   Do not shave body from the neck down 48 hours prior to surgery just in case you cut yourself which could leave a site for infection.  Also, freshly shaved skin may become irritated if using the CHG soap.  Contact lenses, hearing aids and dentures may not be worn into surgery.  Do not bring valuables to the hospital. [facility] is not responsible for any missing/lost belongings or valuables.   Bring your Bi-PAP to the hospital with you in case you may have to spend the night.   Notify your doctor if there is any change in your medical condition (cold, fever, infection).  Wear comfortable clothing (specific to your surgery type) to the hospital.  After surgery, you can help prevent lung complications by doing breathing exercises.  Take deep breaths and cough every 1-2 hours.  Your doctor may order a device called an Incentive Spirometer to help you take deep breaths.  If you are being admitted to the hospital overnight, leave your suitcase in the car. After surgery it may be brought to your room.  Please call the Pre-admissions Testing Dept. at [phone] if you have any questions about these instructions.  Surgery Visitation Policy:  Patients undergoing a surgery or procedure may have one family  member or support person with them as long as that person is not COVID-19 positive or experiencing its symptoms.  That person may remain in the waiting area during the procedure and may rotate out with other people.  Inpatient Visitation:    Visiting hours are 7 a.m. to 8 p.m. Up to two visitors ages 16+ are allowed at one time in a patient room. The visitors may rotate out with other people during the day. Visitors must check out when they leave, or other visitors will not be allowed. One designated support person may remain overnight. The visitor must pass COVID-19 screenings, use hand sanitizer when entering and exiting the patient's room and wear a mask at all times, including in the patient's room. Patients must also wear a mask when staff or their visitor are in the room. Masking is required regardless of vaccination status.

## 2021-02-25 LAB — SARS CORONAVIRUS 2 (TAT 6-24 HRS): SARS Coronavirus 2: NEGATIVE

## 2021-02-26 MED ORDER — CHLORHEXIDINE GLUCONATE 0.12 % MT SOLN: 15.0000 mL | Freq: Once | OROMUCOSAL | Status: AC

## 2021-02-26 MED ORDER — FAMOTIDINE 20 MG PO TABS: 20.0000 mg | ORAL_TABLET | Freq: Once | ORAL | Status: AC

## 2021-02-26 MED ORDER — LACTATED RINGERS IV SOLN: INTRAVENOUS | Status: DC

## 2021-02-26 MED ORDER — ORAL CARE MOUTH RINSE: 15.0000 mL | Freq: Once | OROMUCOSAL | Status: AC

## 2021-02-26 MED ORDER — CEFAZOLIN SODIUM-DEXTROSE 2-4 GM/100ML-% IV SOLN
2.0000 g | INTRAVENOUS | Status: AC
  Administered 2021-02-27: 2 g via INTRAVENOUS

## 2021-02-27 ENCOUNTER — Encounter: Payer: Self-pay | Admitting: Surgery

## 2021-02-27 ENCOUNTER — Ambulatory Visit: Payer: BC Managed Care – PPO | Admitting: Anesthesiology

## 2021-02-27 ENCOUNTER — Ambulatory Visit
Admission: RE | Admit: 2021-02-27 | Discharge: 2021-02-27 | Disposition: A | Discharge status: Home / Self Care | Payer: BC Managed Care – PPO | Attending: Surgery | Admitting: Surgery

## 2021-02-27 ENCOUNTER — Ambulatory Visit: Payer: BC Managed Care – PPO | Admitting: Urgent Care

## 2021-02-27 ENCOUNTER — Encounter
Admission: RE | Disposition: A | Discharge status: Home / Self Care | Payer: Self-pay | Source: Home / Self Care | Attending: Surgery

## 2021-02-27 ENCOUNTER — Ambulatory Visit: Payer: BC Managed Care – PPO

## 2021-02-27 ENCOUNTER — Other Ambulatory Visit: Payer: Self-pay

## 2021-02-27 DIAGNOSIS — S83281A Other tear of lateral meniscus, current injury, right knee, initial encounter: Secondary | ICD-10-CM | POA: Insufficient documentation

## 2021-02-27 DIAGNOSIS — S83231A Complex tear of medial meniscus, current injury, right knee, initial encounter: Secondary | ICD-10-CM | POA: Insufficient documentation

## 2021-02-27 DIAGNOSIS — G4733 Obstructive sleep apnea (adult) (pediatric): Secondary | ICD-10-CM | POA: Diagnosis not present

## 2021-02-27 DIAGNOSIS — M1711 Unilateral primary osteoarthritis, right knee: Secondary | ICD-10-CM | POA: Diagnosis not present

## 2021-02-27 DIAGNOSIS — M109 Gout, unspecified: Secondary | ICD-10-CM | POA: Insufficient documentation

## 2021-02-27 DIAGNOSIS — I2510 Atherosclerotic heart disease of native coronary artery without angina pectoris: Secondary | ICD-10-CM | POA: Insufficient documentation

## 2021-02-27 DIAGNOSIS — Z96651 Presence of right artificial knee joint: Secondary | ICD-10-CM

## 2021-02-27 DIAGNOSIS — E785 Hyperlipidemia, unspecified: Secondary | ICD-10-CM | POA: Insufficient documentation

## 2021-02-27 DIAGNOSIS — W010XXA Fall on same level from slipping, tripping and stumbling without subsequent striking against object, initial encounter: Secondary | ICD-10-CM | POA: Insufficient documentation

## 2021-02-27 DIAGNOSIS — I10 Essential (primary) hypertension: Secondary | ICD-10-CM | POA: Insufficient documentation

## 2021-02-27 DIAGNOSIS — Z96652 Presence of left artificial knee joint: Secondary | ICD-10-CM | POA: Diagnosis not present

## 2021-02-27 HISTORY — PX: TOTAL KNEE ARTHROPLASTY: SHX125

## 2021-02-27 LAB — GLUCOSE, CAPILLARY
Glucose-Capillary: 136 mg/dL — ABNORMAL HIGH (ref 70–99)
Glucose-Capillary: 173 mg/dL — ABNORMAL HIGH (ref 70–99)

## 2021-02-27 LAB — ABO/RH: ABO/RH(D): O POS

## 2021-02-27 SURGERY — ARTHROPLASTY, KNEE, TOTAL
Anesthesia: Spinal | Site: Knee | Laterality: Right

## 2021-02-27 MED ORDER — MIDAZOLAM HCL 2 MG/2ML IJ SOLN
INTRAMUSCULAR | Status: AC
  Filled 2021-02-27: qty 2

## 2021-02-27 MED ORDER — METOCLOPRAMIDE HCL 5 MG/ML IJ SOLN: 5.0000 mg | Freq: Three times a day (TID) | INTRAMUSCULAR | Status: DC | PRN

## 2021-02-27 MED ORDER — CHLORHEXIDINE GLUCONATE 0.12 % MT SOLN
OROMUCOSAL | Status: AC
  Administered 2021-02-27: 15 mL via OROMUCOSAL
  Filled 2021-02-27: qty 15

## 2021-02-27 MED ORDER — 0.9 % SODIUM CHLORIDE (POUR BTL) OPTIME
TOPICAL | Status: DC | PRN
  Administered 2021-02-27: 500 mL

## 2021-02-27 MED ORDER — KETOROLAC TROMETHAMINE 30 MG/ML IJ SOLN: 30.0000 mg | Freq: Once | INTRAMUSCULAR | Status: AC

## 2021-02-27 MED ORDER — ONDANSETRON HCL 4 MG/2ML IJ SOLN: 4.0000 mg | Freq: Once | INTRAMUSCULAR | Status: DC | PRN

## 2021-02-27 MED ORDER — PROPOFOL 500 MG/50ML IV EMUL
INTRAVENOUS | Status: DC | PRN
  Administered 2021-02-27: 100 ug/kg/min via INTRAVENOUS
  Administered 2021-02-27: 50 mg via INTRAVENOUS

## 2021-02-27 MED ORDER — SODIUM CHLORIDE 0.9 % BOLUS PEDS
250.0000 mL | Freq: Once | INTRAVENOUS | Status: AC
  Administered 2021-02-27: 250 mL via INTRAVENOUS

## 2021-02-27 MED ORDER — TRANEXAMIC ACID 1000 MG/10ML IV SOLN
INTRAVENOUS | Status: AC
  Filled 2021-02-27: qty 10

## 2021-02-27 MED ORDER — ONDANSETRON HCL 4 MG/2ML IJ SOLN
INTRAMUSCULAR | Status: AC
  Filled 2021-02-27: qty 2

## 2021-02-27 MED ORDER — ACETAMINOPHEN 500 MG PO TABS: 1000.0000 mg | ORAL_TABLET | Freq: Four times a day (QID) | ORAL | Status: DC

## 2021-02-27 MED ORDER — CEFAZOLIN SODIUM-DEXTROSE 2-4 GM/100ML-% IV SOLN
2.0000 g | Freq: Four times a day (QID) | INTRAVENOUS | Status: DC
Start: 2021-02-27 — End: 2021-02-27
  Administered 2021-02-27: 2 g via INTRAVENOUS

## 2021-02-27 MED ORDER — ACETAMINOPHEN 10 MG/ML IV SOLN
INTRAVENOUS | Status: AC
  Filled 2021-02-27: qty 100

## 2021-02-27 MED ORDER — PROPOFOL 1000 MG/100ML IV EMUL
INTRAVENOUS | Status: AC
  Filled 2021-02-27: qty 100

## 2021-02-27 MED ORDER — KETAMINE HCL 50 MG/5ML IJ SOSY
PREFILLED_SYRINGE | INTRAMUSCULAR | Status: AC
  Filled 2021-02-27: qty 5

## 2021-02-27 MED ORDER — OXYCODONE HCL 5 MG PO TABS: 5.0000 mg | ORAL_TABLET | ORAL | Status: DC | PRN

## 2021-02-27 MED ORDER — FENTANYL CITRATE (PF) 100 MCG/2ML IJ SOLN: 25.0000 ug | INTRAMUSCULAR | Status: DC | PRN

## 2021-02-27 MED ORDER — KETOROLAC TROMETHAMINE 30 MG/ML IJ SOLN
INTRAMUSCULAR | Status: AC
  Administered 2021-02-27: 30 mg via INTRAVENOUS
  Filled 2021-02-27: qty 1

## 2021-02-27 MED ORDER — BUPIVACAINE LIPOSOME 1.3 % IJ SUSP
INTRAMUSCULAR | Status: AC
  Filled 2021-02-27: qty 20

## 2021-02-27 MED ORDER — OXYCODONE HCL 5 MG PO TABS
ORAL_TABLET | ORAL | Status: AC
  Administered 2021-02-27: 10 mg via ORAL
  Filled 2021-02-27: qty 2

## 2021-02-27 MED ORDER — ONDANSETRON HCL 4 MG/2ML IJ SOLN: 4.0000 mg | Freq: Four times a day (QID) | INTRAMUSCULAR | Status: DC | PRN

## 2021-02-27 MED ORDER — CEFAZOLIN SODIUM-DEXTROSE 2-4 GM/100ML-% IV SOLN
INTRAVENOUS | Status: AC
  Filled 2021-02-27: qty 100

## 2021-02-27 MED ORDER — TRANEXAMIC ACID 1000 MG/10ML IV SOLN
INTRAVENOUS | Status: DC | PRN
  Administered 2021-02-27: 1000 mg via TOPICAL

## 2021-02-27 MED ORDER — ONDANSETRON HCL 4 MG/2ML IJ SOLN
INTRAMUSCULAR | Status: DC | PRN
  Administered 2021-02-27: 4 mg via INTRAVENOUS

## 2021-02-27 MED ORDER — OXYCODONE HCL 5 MG PO TABS: 5.0000 mg | ORAL_TABLET | ORAL | 0 refills | Status: DC | PRN

## 2021-02-27 MED ORDER — DEXAMETHASONE SODIUM PHOSPHATE 10 MG/ML IJ SOLN
INTRAMUSCULAR | Status: DC | PRN
  Administered 2021-02-27: 10 mg via INTRAVENOUS

## 2021-02-27 MED ORDER — PHENYLEPHRINE HCL-NACL 20-0.9 MG/250ML-% IV SOLN
INTRAVENOUS | Status: DC | PRN
  Administered 2021-02-27: 23.6 ug/min via INTRAVENOUS

## 2021-02-27 MED ORDER — KETAMINE HCL 10 MG/ML IJ SOLN
INTRAMUSCULAR | Status: DC | PRN
  Administered 2021-02-27: 30 mg via INTRAVENOUS
  Administered 2021-02-27: 20 mg via INTRAVENOUS

## 2021-02-27 MED ORDER — PHENYLEPHRINE HCL (PRESSORS) 10 MG/ML IV SOLN
INTRAVENOUS | Status: AC
  Filled 2021-02-27: qty 2

## 2021-02-27 MED ORDER — FENTANYL CITRATE (PF) 100 MCG/2ML IJ SOLN
INTRAMUSCULAR | Status: AC
  Filled 2021-02-27: qty 2

## 2021-02-27 MED ORDER — BUPIVACAINE HCL (PF) 0.5 % IJ SOLN
INTRAMUSCULAR | Status: AC
  Filled 2021-02-27: qty 10

## 2021-02-27 MED ORDER — PROPOFOL 10 MG/ML IV BOLUS
INTRAVENOUS | Status: AC
  Filled 2021-02-27: qty 20

## 2021-02-27 MED ORDER — BUPIVACAINE-EPINEPHRINE (PF) 0.5% -1:200000 IJ SOLN
INTRAMUSCULAR | Status: DC | PRN
  Administered 2021-02-27: 30 mL via PERINEURAL

## 2021-02-27 MED ORDER — BUPIVACAINE HCL (PF) 0.5 % IJ SOLN
INTRAMUSCULAR | Status: DC | PRN
  Administered 2021-02-27: 3 mL

## 2021-02-27 MED ORDER — SODIUM CHLORIDE 0.9 % IR SOLN
Status: DC | PRN
  Administered 2021-02-27: 3000 mL

## 2021-02-27 MED ORDER — SODIUM CHLORIDE 0.9 % IV SOLN: INTRAVENOUS | Status: DC

## 2021-02-27 MED ORDER — FAMOTIDINE 20 MG PO TABS
ORAL_TABLET | ORAL | Status: AC
  Administered 2021-02-27: 20 mg via ORAL
  Filled 2021-02-27: qty 1

## 2021-02-27 MED ORDER — APIXABAN 2.5 MG PO TABS: 2.5000 mg | ORAL_TABLET | Freq: Two times a day (BID) | ORAL | 0 refills | Status: DC

## 2021-02-27 MED ORDER — METOCLOPRAMIDE HCL 10 MG PO TABS: 5.0000 mg | ORAL_TABLET | Freq: Three times a day (TID) | ORAL | Status: DC | PRN

## 2021-02-27 MED ORDER — KETOROLAC TROMETHAMINE 15 MG/ML IJ SOLN: 15.0000 mg | Freq: Four times a day (QID) | INTRAMUSCULAR | Status: DC

## 2021-02-27 MED ORDER — ONDANSETRON HCL 4 MG PO TABS: 4.0000 mg | ORAL_TABLET | Freq: Four times a day (QID) | ORAL | Status: DC | PRN

## 2021-02-27 MED ORDER — ACETAMINOPHEN 10 MG/ML IV SOLN
INTRAVENOUS | Status: DC | PRN
  Administered 2021-02-27: 1000 mg via INTRAVENOUS

## 2021-02-27 MED ORDER — SODIUM CHLORIDE 0.9 % IV SOLN
INTRAVENOUS | Status: DC | PRN
  Administered 2021-02-27: 60 mL

## 2021-02-27 MED ORDER — FENTANYL CITRATE (PF) 100 MCG/2ML IJ SOLN
INTRAMUSCULAR | Status: DC | PRN
  Administered 2021-02-27: 50 ug via INTRAVENOUS

## 2021-02-27 MED ORDER — MIDAZOLAM HCL 5 MG/5ML IJ SOLN
INTRAMUSCULAR | Status: DC | PRN
  Administered 2021-02-27 (×2): 1 mg via INTRAVENOUS

## 2021-02-27 MED ORDER — DEXAMETHASONE SODIUM PHOSPHATE 10 MG/ML IJ SOLN
INTRAMUSCULAR | Status: AC
  Filled 2021-02-27: qty 1

## 2021-02-27 MED ORDER — SODIUM CHLORIDE FLUSH 0.9 % IV SOLN
INTRAVENOUS | Status: AC
  Filled 2021-02-27: qty 40

## 2021-02-27 MED ORDER — BUPIVACAINE-EPINEPHRINE (PF) 0.5% -1:200000 IJ SOLN
INTRAMUSCULAR | Status: AC
  Filled 2021-02-27: qty 30

## 2021-02-27 SURGICAL SUPPLY — 62 items
BIT DRILL QUICK REL 1/8 2PK SL (DRILL) ×1 IMPLANT
BLADE SAW SAG 25X90X1.19 (BLADE) ×2 IMPLANT
BLADE SURG SZ20 CARB STEEL (BLADE) ×2 IMPLANT
BNDG ELASTIC 6X5.8 VLCR NS LF (GAUZE/BANDAGES/DRESSINGS) ×2 IMPLANT
BNDG ELASTIC 6X5.8 VLCR STR LF (GAUZE/BANDAGES/DRESSINGS) ×2 IMPLANT
CEMENT BONE R 1X40 (Cement) ×4 IMPLANT
CEMENT VACUUM MIXING SYSTEM (MISCELLANEOUS) ×2 IMPLANT
CHLORAPREP W/TINT 26 (MISCELLANEOUS) ×4 IMPLANT
COMPONENT PATELLAR VGD 7.8X3 (Joint) ×2 IMPLANT
COOLER POLAR GLACIER W/PUMP (MISCELLANEOUS) ×2 IMPLANT
COVER MAYO STAND REUSABLE (DRAPES) ×2 IMPLANT
CUFF TOURN SGL QUICK 24 (TOURNIQUET CUFF)
CUFF TOURN SGL QUICK 34 (TOURNIQUET CUFF)
CUFF TRNQT CYL 24X4X16.5-23 (TOURNIQUET CUFF) IMPLANT
CUFF TRNQT CYL 34X4.125X (TOURNIQUET CUFF) IMPLANT
DRAPE 3/4 80X56 (DRAPES) ×2 IMPLANT
DRAPE IMP U-DRAPE 54X76 (DRAPES) ×2 IMPLANT
DRAPE INCISE 23X17 IOBAN STRL (DRAPES) ×1
DRAPE INCISE IOBAN 23X17 STRL (DRAPES) ×1 IMPLANT
DRAPE INCISE IOBAN 66X45 STRL (DRAPES) ×2 IMPLANT
DRILL QUICK RELEASE 1/8 INCH (DRILL) ×1
DRSG MEPILEX SACRM 8.7X9.8 (GAUZE/BANDAGES/DRESSINGS) ×2 IMPLANT
DRSG OPSITE POSTOP 4X10 (GAUZE/BANDAGES/DRESSINGS) IMPLANT
DRSG OPSITE POSTOP 4X8 (GAUZE/BANDAGES/DRESSINGS) ×2 IMPLANT
ELECT REM PT RETURN 9FT ADLT (ELECTROSURGICAL) ×2
ELECTRODE REM PT RTRN 9FT ADLT (ELECTROSURGICAL) ×1 IMPLANT
FEMORAL COMPONENT 70MM RIGHT (Joint) ×2 IMPLANT
GAUZE 4X4 16PLY ~~LOC~~+RFID DBL (SPONGE) ×2 IMPLANT
GAUZE XEROFORM 1X8 LF (GAUZE/BANDAGES/DRESSINGS) ×2 IMPLANT
GLOVE SRG 8 PF TXTR STRL LF DI (GLOVE) ×1 IMPLANT
GLOVE SURG ENC MOIS LTX SZ7.5 (GLOVE) ×8 IMPLANT
GLOVE SURG ENC MOIS LTX SZ8 (GLOVE) ×8 IMPLANT
GLOVE SURG UNDER LTX SZ8 (GLOVE) ×2 IMPLANT
GLOVE SURG UNDER POLY LF SZ8 (GLOVE) ×1
GOWN STRL REUS W/ TWL LRG LVL3 (GOWN DISPOSABLE) ×1 IMPLANT
GOWN STRL REUS W/ TWL XL LVL3 (GOWN DISPOSABLE) ×1 IMPLANT
GOWN STRL REUS W/TWL LRG LVL3 (GOWN DISPOSABLE) ×1
GOWN STRL REUS W/TWL XL LVL3 (GOWN DISPOSABLE) ×1
INSERT TIB BEARING 75 10 (Insert) ×2 IMPLANT
IV NS IRRIG 3000ML ARTHROMATIC (IV SOLUTION) ×2 IMPLANT
KIT TURNOVER KIT A (KITS) ×2 IMPLANT
MANIFOLD NEPTUNE II (INSTRUMENTS) ×2 IMPLANT
NEEDLE SPNL 20GX3.5 QUINCKE YW (NEEDLE) ×2 IMPLANT
NS IRRIG 1000ML POUR BTL (IV SOLUTION) ×2 IMPLANT
PACK TOTAL KNEE (MISCELLANEOUS) ×2 IMPLANT
PAD WRAPON POLAR KNEE (MISCELLANEOUS) ×1 IMPLANT
PLATE KNEE TIBIAL 75MM FIXED (Plate) ×2 IMPLANT
PULSAVAC PLUS IRRIG FAN TIP (DISPOSABLE) ×2
SPONGE T-LAP 18X18 ~~LOC~~+RFID (SPONGE) ×6 IMPLANT
STAPLER SKIN PROX 35W (STAPLE) ×2 IMPLANT
SUCTION FRAZIER HANDLE 10FR (MISCELLANEOUS) ×1
SUCTION TUBE FRAZIER 10FR DISP (MISCELLANEOUS) ×1 IMPLANT
SUT VIC AB 0 CT1 36 (SUTURE) ×6 IMPLANT
SUT VIC AB 2-0 CT1 27 (SUTURE) ×3
SUT VIC AB 2-0 CT1 TAPERPNT 27 (SUTURE) ×3 IMPLANT
SYR 10ML LL (SYRINGE) ×2 IMPLANT
SYR 20ML LL LF (SYRINGE) ×2 IMPLANT
SYR 30ML LL (SYRINGE) IMPLANT
TIP FAN IRRIG PULSAVAC PLUS (DISPOSABLE) ×1 IMPLANT
TRAP FLUID SMOKE EVACUATOR (MISCELLANEOUS) ×2 IMPLANT
WATER STERILE IRR 500ML POUR (IV SOLUTION) ×2 IMPLANT
WRAPON POLAR PAD KNEE (MISCELLANEOUS) ×2

## 2021-02-27 NOTE — H&P (Signed)
History of Present Illness: Austin Wilson is a 55 y.o.male who is being referred by Austin Skeens, PA-C, for right knee pain. The symptoms began 6 to 7 months ago and developed following an injury in which his right leg buckled on him after getting off of his lawnmower and causing him to fall onto his flexed knee. His symptoms worsened in June, 2022, when he apparently slipped and fell while trying to get up onto his tractor, at which time he actually heard a pop in his knee. Because of continued symptoms, he saw Austin Skeens, PA-C. Concerned that he may have torn his meniscus, the patient was sent for an MRI scan and referred to me for further evaluation and treatment. He reports 3/10 pain. The pain is located along the lateral and medial aspect of the knee. The pain is described as aching, dull, stabbing and throbbing. The symptoms are aggravated with normal daily activities, with sleeping, using stairs, at higher levels of activity, walking, standing and standing pivot. He also describes mechanical symptoms. He has associated swelling and no deformity. He has tried over-the-counter medications, anti-inflammatories, ice and heat with temporary partial relief. He denies any numbness or paresthesias down to his foot. He notes that he did have a right knee arthroscopic procedure performed in 1998 from which he has done quite well. He also is status post a left total knee arthroplasty by Dr. Gavin Wilson several years ago from which he has done quite well.  Current Outpatient Medications:  atorvastatin (LIPITOR) 80 MG tablet Take 1 tablet (80 mg total) by mouth once daily 90 tablet 3   BD INTEGRA SYRINGE 3 mL 21 gauge x 1 1/2" Syrg USE THIS NEEDLE TO INJECTION   BD REGULAR BEVEL NEEDLES 18 gauge x 1" Ndle USE TO PULL UP MEDICATION AS DIRECTED   indomethacin (INDOCIN) 50 MG capsule TAKE ONE CAPSULE (50 MG TOTAL) BY MOUTH TWICE DAILY AS NEEDED (DO NOT TAKE WITH ALEVE) 30 capsule 1   lisinopriL (ZESTRIL) 20 MG tablet  Take 1 tablet (20 mg total) by mouth 2 (two) times daily for 90 days 180 tablet 3   metFORMIN (GLUCOPHAGE-XR) 500 MG XR tablet Take 1 tablet (500 mg total) by mouth daily with dinner 90 tablet 3   MITIGARE 0.6 mg capsule Take 2 capsules at onset of gout flare. Take one additional capsule 1 hour later if symptoms persist. 6 capsule 1   multivitamin with iron (CENTRUM ADULT COMPLETE) tablet Take 1 tablet by mouth once daily   testosterone cypionate (DEPO-TESTOSTERONE) 200 mg/mL injection Inject 200 mg into the muscle every 14 (fourteen) days   traZODone (DESYREL) 50 MG tablet Take 1 tablet (50 mg total) by mouth nightly 30 tablet 11   verapamiL (VERELAN) 240 MG SR capsule Take 1 capsule (240 mg total) by mouth nightly for 90 days 90 capsule 3   verapamiL (VERELAN) 360 MG SR capsule   Allergies:   Pravachol [Pravastatin] Other (Stomach pains)   Past Medical History:   Alcohol-induced acute pancreatitis 04/16/2017 (hospitalized)   Alcohol-induced acute pancreatitis without infection or necrosis (06/12/2016)   Chronic gouty arthritis 04/29/2016   Gout 09/30/2012   Hyperglycemia 05/29/2014   Hyperlipidemia   Hypertension   Osteoarthritis   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   Past Surgical History:   APPENDECTOMY   TKA Left 10/04/2017 (left total makoplasty-- Dr Austin Wilson)   KNEE ARTHROSCOPY (bilat knees- track, wrestler, foot, etc.- bilat acl tears)   Family History  Problem Relation  Age of Onset   Breast cancer Mother   High blood pressure (Hypertension) Mother   Coronary Artery Disease (Blocked arteries around heart) Father   Hyperlipidemia (Elevated cholesterol) Father   High blood pressure (Hypertension) Father   Brain cancer Maternal Grandmother   Social History:   Socioeconomic History:   Marital status: Married  Tobacco Use   Smoking status: Never Smoker   Smokeless tobacco: Current User  Types: Snuff  Vaping Use   Vaping Use: Never used   Substance and Sexual Activity   Alcohol use: Yes  Alcohol/week: 10.0 standard drinks  Types: 10 Glasses of wine per week  Comment: less now that he had a pancreatic flare last year   Drug use: No   Sexual activity: Yes  Partners: Female  Birth control/protection: None  Social History Narrative  Married to Austin Wilson ( no children from any marriage)- 3rd marriage for both of the couple- Austin Wilson, 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   Review of Systems:  A comprehensive 14 point ROS was performed, reviewed, and the pertinent orthopaedic findings are documented in the HPI.  Physical Exam: Vitals:  02/14/21 0906  BP: (!) 142/88  Weight: (!) 119.4 kg (263 lb 3.2 oz)  Height: 170.2 cm (5\' 7" )  PainSc: 3  PainLoc: Knee   General/Constitutional: Pleasant significantly overweight middle-aged male in no acute distress. Neuro/Psych: Normal mood and affect, oriented to person, place and time. Eyes: Non-icteric. Pupils are equal, round, and reactive to light, and exhibit synchronous movement. Lymphatic: No palpable adenopathy. Respiratory: Lungs clear to auscultation, Normal chest excursion, No wheezes and Non-labored breathing Cardiovascular: Regular rate and rhythm. No murmurs. and No edema, swelling or tenderness, except as noted in detailed exam. Vascular: No edema, swelling or tenderness, except as noted in detailed exam. Integumentary: No impressive skin lesions present, except as noted in detailed exam. Musculoskeletal: Unremarkable, except as noted in detailed exam.  Right knee exam: GAIT: mild limp and uses no assistive devices. ALIGNMENT: mild varus SKIN: unremarkable SWELLING: mild EFFUSION: trace WARMTH: no warmth TENDERNESS: moderate over the lateral joint line and medial joint line ROM: 0 to 105 degrees with pain in maximal flexion McMURRAY'S: positive PATELLOFEMORAL: normal  tracking with no peri-patellar tenderness and negative apprehension sign CREPITUS: Mild patellofemoral crepitance LACHMAN'S: negative PIVOT SHIFT: negative ANTERIOR DRAWER: negative POSTERIOR DRAWER: negative VARUS/VALGUS: stable  He is neurovascularly intact to the right lower extremity and foot.  Knee Imaging: Recent AP weightbearing of both knees, as well as lateral and merchant views of the right knee are available for review and have been reviewed by myself.. These films demonstrate moderate degenerative changes, primarily involving the medial compartment with 60% medial joint space narrowing. Overall alignment is mild varus. No fractures, lytic lesions, or abnormal calcifications are noted.  Knee Imaging, external: Right knee: A recent MRI scan of the right knee is available for review. By report, the study demonstrates evidence of complex tearing of the posterior portions of the medial and lateral menisci. There is significant "mucoid degeneration" of both the ACL and PCL without partial or full-thickness tears. The lateral collateral ligaments are in satisfactory condition. He also has "moderate to advanced degenerative chondrosis with areas of full or near full-thickness cartilage loss" involving the medial compartment and moderate degenerative changes of the lateral and patellofemoral compartments. Both the films and report reviewed by myself and discussed with the patient and his  wife.  Assessment:   Primary osteoarthritis of right knee Yes   Complex tear of lateral meniscus of right knee as current injury, initial encounter   Complex tear of medial meniscus of right knee as current injury, initial encounter   Plan: The treatment options were discussed with the patient and his wife. In addition, patient educational materials were provided regarding the diagnosis and treatment options. The patient is quite frustrated by his symptoms and functional limitations, and is ready to consider  more aggressive treatment options. After discussing an arthroscopic debridement with partial medial and lateral meniscectomies versus a total knee arthroplasty, the patient has elected to proceed with a right total knee arthroplasty. The procedure was discussed with the patient, as were the potential risks (including bleeding, infection, nerve and/or blood vessel injury, persistent or recurrent pain, loosening and/or failure of the components, dislocation, need for further surgery, blood clots, strokes, heart attacks and/or arhythmias, pneumonia, etc.) and benefits. The patient states his/her understanding and wishes to proceed. All of the patient's questions and concerns were answered. He can call any time with further concerns. He will return to work without restrictions. He will follow up post-surgery, routine.   H&P reviewed and patient re-examined. No changes.

## 2021-02-27 NOTE — Discharge Instructions (Addendum)
Orthopedic discharge instructions: May shower with intact OpSite dressing. Apply ice frequently to knee or use Polar Care. Start Eliquis 2.5 mg twice daily for 2 weeks on Friday, 02/28/2021, then take aspirin 325 mg daily for 4 weeks. Take oxycodone as prescribed when needed.  May supplement with ES Tylenol if necessary. May weight-bear as tolerated on right leg - use walker or crutches for balance and support. Follow-up in 10-14 days or as scheduled.   AMBULATORY SURGERY  DISCHARGE INSTRUCTIONS   The drugs that you were given will stay in your system until tomorrow so for the next 24 hours you should not:  Drive an automobile Make any legal decisions Drink any alcoholic beverage   You may resume regular meals tomorrow.  Today it is better to start with liquids and gradually work up to solid foods.  You may eat anything you prefer, but it is better to start with liquids, then soup and crackers, and gradually work up to solid foods.   Please notify your doctor immediately if you have any unusual bleeding, trouble breathing, redness and pain at the surgery site, drainage, fever, or pain not relieved by medication.    Additional Instructions:   Please contact your physician with any problems or Same Day Surgery at [phone], Monday through Friday 6 am to 4 pm, or [place] at [facility] number at [phone].

## 2021-02-27 NOTE — Anesthesia Procedure Notes (Addendum)
Spinal  Patient location during procedure: OR End time: 02/27/2021 7:50 AM Reason for block: surgical anesthesia Staffing Performed: anesthesiologist and resident/CRNA  Anesthesiologist: Yevette Edwards, MD Resident/CRNA: Reece Agar, CRNA Preanesthetic Checklist Completed: patient identified, IV checked, site marked, risks and benefits discussed, surgical consent, monitors and equipment checked, pre-op evaluation and timeout performed Spinal Block Patient position: sitting Prep: DuraPrep Patient monitoring: heart rate, cardiac monitor, continuous pulse ox and blood pressure Approach: midline Location: L4-5 Injection technique: single-shot Needle Needle type: Sprotte and Whitacre  Needle gauge: 24 G Needle length: 9 cm Assessment Sensory level: T4 Events: CSF return Additional Notes 3 attempts necessary to achieve sub arach. Space but tolerated well and no paresthesia. Clear csf and free flow tolerated well. ja

## 2021-02-27 NOTE — Op Note (Signed)
02/27/2021  9:58 AM  Patient:   Austin Wilson  Pre-Op Diagnosis:   Degenerative joint disease, right knee.  Post-Op Diagnosis:   Same  Procedure:   Right TKA using all-cemented Biomet Vanguard system with a 70 mm PCR femur, a 75 mm tibial tray with a 10 mm E-poly insert, and a 34 x 7.8 mm all-poly 3-pegged domed patella.  Surgeon:   Maryagnes Amos, MD  Assistant:   Horris Latino, PA-C   Anesthesia:   Spinal  Findings:   As above  Complications:   None  EBL:   10 cc  Fluids:   900 cc crystalloid  UOP:   None  TT:   90 minutes at 300 mmHg  Drains:   None  Closure:   Staples  Implants:   As above  Brief Clinical Note:   The patient is a 55 year old male with a history of progressively worsening right knee pain. The patient's symptoms have progressed despite medications, activity modification, injections, etc. The patient's history and examination were consistent with advanced degenerative joint disease of the right knee confirmed by plain radiographs. The patient presents at this time for a right total knee arthroplasty.  Procedure:   The patient was brought into the operating room. After adequate spinal anesthesia was obtained, the patient was lain in the supine position before the right lower extremity was prepped with ChloraPrep solution and draped sterilely. Preoperative antibiotics were administered. After verifying the proper laterality with a surgical timeout, the limb was exsanguinated with an Esmarch and the tourniquet inflated to 300 mmHg.   A standard anterior approach to the knee was made through an approximately 7 inch incision. The incision was carried down through the subcutaneous tissues to expose superficial retinaculum. This was split the length of the incision and the medial flap elevated sufficiently to expose the medial retinaculum. The medial retinaculum was incised, leaving a 3-4 mm cuff of tissue on the patella. This was extended distally along the  medial border of the patellar tendon and proximally through the medial third of the quadriceps tendon. A subtotal fat pad excision was performed before the soft tissues were elevated off the anteromedial and anterolateral aspects of the proximal tibia to the level of the collateral ligaments. The anterior portions of the medial and lateral menisci were removed, as was the anterior cruciate ligament. With the knee flexed to 90, the external tibial guide was positioned and the appropriate proximal tibial cut made. This piece was taken to the back table where it was measured and found to be optimally replicated by a 75 mm component.  Attention was directed to the distal femur. The intramedullary canal was accessed through a 3/8" drill hole. The intramedullary guide was inserted and positioned in order to obtain a neutral flexion gap. The intercondylar block was positioned with care taken to avoid notching the anterior cortex of the femur. The appropriate cut was made. Next, the distal cutting block was placed at 5 of valgus alignment. Using the 9 mm slot, the distal cut was made. The distal femur was measured and found to be optimally replicated by the 70 mm component. The 70 mm 4-in-1 cutting block was positioned and first the posterior, then the posterior chamfer, the anterior chamfer, and finally the anterior cuts were made. At this point, the posterior portions medial and lateral menisci were removed. A trial reduction was performed using the appropriate femoral and tibial components with the 10 mm insert. This demonstrated excellent stability to varus and  valgus stressing both in flexion and extension while permitting full extension. Patella tracking was assessed and found to be excellent. Therefore, the tibial guide position was marked on the proximal tibia. The patella thickness was measured and found to be 22 mm. Therefore, the appropriate cut was made. The patellar surface was measured and found to be  optimally replicated by the 34 mm component. The three peg holes were drilled in place before the trial button was inserted. Patella tracking was assessed and found to be excellent, passing the "no thumb test". The lug holes were drilled into the distal femur before the trial component was removed, leaving only the tibial tray. The keel was then created using the appropriate tower, reamer, and punch.  The bony surfaces were prepared for cementing by irrigating them thoroughly with sterile saline solution via the jet lavage system. A bone plug was fashioned from some of the bone that had been removed previously and used to plug the distal femoral canal. In addition, 20 cc of Exparel diluted out to 60 cc with normal saline and 30 cc of 0.5% Sensorcaine were injected into the postero-medial and postero-lateral aspects of the knee, the medial and lateral gutter regions, and the peri-incisional tissues to help with postoperative analgesia. Meanwhile, the cement was being mixed on the back table. When it was ready, the tibial tray was cemented in first. The excess cement was removed using Personal assistant. Next, the femoral component was impacted into place. Again, the excess cement was removed using Personal assistant. The 10 mm trial insert was positioned and the knee brought into extension while the cement hardened. Finally, the patella was cemented into place and secured using the patellar clamp. Again, the excess cement was removed using Personal assistant. Once the cement had hardened, the knee was placed through a range of motion with the findings as described above. Therefore, the trial insert was removed and, after verifying that no cement had been retained posteriorly, the permanent 10 mm anterior stabilized E-polyethylene insert was positioned and secured using the appropriate key locking mechanism. Again the knee was placed through a range of motion with the findings as described above.  The wound was copiously  irrigated with sterile saline solution using the jet lavage system before the quadriceps tendon and retinacular layer were reapproximated using #0 Vicryl interrupted sutures. The superficial retinacular layer also was closed using a running #0 Vicryl suture. A total of 10 cc of transexemic acid (TXA) was injected intra-articularly before the subcutaneous tissues were closed in several layers using 2-0 Vicryl interrupted sutures. The skin was closed using staples. A sterile honeycomb dressing was applied to the skin before the leg was wrapped with an Ace wrap to accommodate the Polar Care device. The patient was then awakened and returned to the recovery room in satisfactory condition after tolerating the procedure well.

## 2021-02-27 NOTE — Transfer of Care (Signed)
Immediate Anesthesia Transfer of Care Note  Patient: Austin Wilson  Procedure(s) Performed: TOTAL KNEE ARTHROPLASTY (Right: Knee)  Patient Location: PACU  Anesthesia Type:Spinal  Level of Consciousness: awake, alert , oriented and patient cooperative  Airway & Oxygen Therapy: Patient Spontanous Breathing and Patient connected to face mask oxygen  Post-op Assessment: Report given to RN and Post -op Vital signs reviewed and stable  Post vital signs: Reviewed and stable  Last Vitals:  Vitals Value Taken Time  BP 133/90 02/27/21 1002  Temp    Pulse 70 02/27/21 1007  Resp 16 02/27/21 1007  SpO2 98 % 02/27/21 1007  Vitals shown include unvalidated device data.  Last Pain:  Vitals:   02/27/21 7209  TempSrc: Oral  PainSc: 0-No pain         Complications: No notable events documented.

## 2021-02-27 NOTE — Anesthesia Preprocedure Evaluation (Signed)
Anesthesia Evaluation  Patient identified by MRN, date of birth, ID band Patient awake    Reviewed: Allergy & Precautions, H&P , NPO status , Patient's Chart, lab work & pertinent test results, reviewed documented beta blocker date and time   Airway Mallampati: III   Neck ROM: full    Dental  (+) Teeth Intact   Pulmonary sleep apnea and Continuous Positive Airway Pressure Ventilation ,    Pulmonary exam normal        Cardiovascular Exercise Tolerance: Good hypertension, On Medications negative cardio ROS Normal cardiovascular exam Rhythm:regular Rate:Normal     Neuro/Psych negative neurological ROS  negative psych ROS   GI/Hepatic Neg liver ROS, GERD  Medicated,  Endo/Other  negative endocrine ROS  Renal/GU negative Renal ROS  negative genitourinary   Musculoskeletal   Abdominal   Peds  Hematology negative hematology ROS (+)   Anesthesia Other Findings Past Medical History: 2018: Alcohol-induced acute pancreatitis No date: Bronchitis No date: Chronic gouty arthritis No date: GERD (gastroesophageal reflux disease) No date: Gout No date: Hyperglycemia No date: Hypertension No date: Mixed hyperlipidemia No date: Pre-diabetes Past Surgical History: 2007: APPENDECTOMY 1998: KNEE ARTHROSCOPY; Right     Comment:  torn lateral meniscus, ACL 1996: KNEE ARTHROSCOPY; Left     Comment:  torn ACL, lateral meniscus 10/04/2017: TOTAL KNEE ARTHROPLASTY; Left BMI    Body Mass Index: 41.04 kg/m     Reproductive/Obstetrics negative OB ROS                             Anesthesia Physical Anesthesia Plan  ASA: 3  Anesthesia Plan: Spinal   Post-op Pain Management:    Induction:   PONV Risk Score and Plan: 2  Airway Management Planned:   Additional Equipment:   Intra-op Plan:   Post-operative Plan:   Informed Consent: I have reviewed the patients History and Physical, chart, labs  and discussed the procedure including the risks, benefits and alternatives for the proposed anesthesia with the patient or authorized representative who has indicated his/her understanding and acceptance.     Dental Advisory Given  Plan Discussed with: CRNA  Anesthesia Plan Comments:         Anesthesia Quick Evaluation

## 2021-02-27 NOTE — Evaluation (Signed)
Physical Therapy Evaluation Patient Details Name: Austin Wilson MRN: [id number] DOB: 10-22-1965 Today's Date: 02/27/2021  History of Present Illness  Pt is a 55 y.o. 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  PMH includes pancreatitis, gout, HLD, htn, OA, s/p L TKA 2019, sleep apnea.  Clinical Impression  Prior to surgery, pt was independent and active; lives with his wife in 1 level home with 4 STE.  Currently pt is modified independent semi-supine to sitting edge of bed; SBA with transfers using RW; CGA progressing to SBA with ambulation 140 feet with RW; and CGA navigating 4 steps with L railing.  Minimal R knee pain reported throughout PT session.  Able to perform R LE SLR independently.  R knee AROM 0-90 degrees.  Pt would benefit from skilled PT to address noted impairments and functional limitations (see below for any additional details).  Upon hospital discharge, pt would benefit from HHPT (pt reports having this set up already and coming this Sunday).      Recommendations for follow up therapy are one component of a multi-disciplinary discharge planning process, led by the attending physician.  Recommendations may be updated based on patient status, additional functional criteria and insurance authorization.  Follow Up Recommendations Home health PT (pt reports HHPT set up already and coming this Sunday)    Equipment Recommendations  Other (comment) (pt has needed DME at home already ([facility] and [facility]))    Recommendations for Other Services       Precautions / Restrictions Precautions Precautions: Fall;Knee Precaution Booklet Issued: Yes (comment) Restrictions Weight Bearing Restrictions: Yes RLE Weight Bearing: Weight bearing as tolerated Other Position/Activity Restrictions: May WBAT on R leg using walker for balance and support      Mobility  Bed Mobility Overal bed mobility: Modified Independent (HOB of stretcher mildly elevated)             General bed mobility  comments: Semi-supine to sitting without any noted difficulty    Transfers Overall transfer level: Needs assistance Equipment used: Rolling walker (2 wheeled) Transfers: Sit to/from [employer] Sit to Stand: Supervision Stand pivot transfers: Supervision       General transfer comment: initial vc's for UE placement  Ambulation/Gait Ambulation/Gait assistance: Min guard;Supervision Gait Distance (Feet): 140 Feet Assistive device: Rolling walker (2 wheeled) Gait Pattern/deviations: Antalgic Gait velocity: mildly decreased   General Gait Details: mild decreased stance time R LE; partial progressing to step through gait pattern; mild R knee flexion during swing phase and good R heel strike during initial contact phase  Stairs Stairs: Yes Stairs assistance: Min guard Stair Management: One rail Left;Step to pattern;Forwards Number of Stairs: 4 General stair comments: pt able to verbalize and perform stairs safely with minimal cueing  Wheelchair Mobility    Modified Rankin (Stroke Patients Only)       Balance Overall balance assessment: Needs assistance Sitting-balance support: No upper extremity supported;Feet supported Sitting balance-Leahy Scale: Normal Sitting balance - Comments: steady sitting reaching outside BOS   Standing balance support: No upper extremity supported Standing balance-Leahy Scale: Good Standing balance comment: steady static standing adjusting shorts                             Pertinent Vitals/Pain Pain Assessment: 0-10 Pain Score: 3  Pain Location: R knee Pain Descriptors / Indicators: Sore Pain Intervention(s): Limited activity within patient's tolerance;Monitored during session;Premedicated before session;Repositioned;Other (comment) (polar care applied) Vitals (HR and O2 on  room air) stable and WFL throughout treatment session.    Home Living Family/patient expects to be discharged to:: Private  residence Living Arrangements: Spouse/significant other Available Help at Discharge: Family;Available 24 hours/day Type of Home: House Home Access: Stairs to enter   [employer] of Steps: 4 steps to enter with 2 grab bars on L side Home Layout: One level Home Equipment: Shower seat - built in;Grab bars - tub/shower;Cane - single point;Walker - 2 wheels;Crutches;Bedside commode Additional Comments: Adjustable bed    Prior Function Level of Independence: Independent               Hand Dominance        Extremity/Trunk Assessment   Upper Extremity Assessment Upper Extremity Assessment: Overall WFL for tasks assessed    Lower Extremity Assessment Lower Extremity Assessment: RLE deficits/detail (L LE WFL) RLE Deficits / Details: good R isometric quad set strength; at least 3/5 AROM ankle DF/PF; able to perform R LE SLR independently; mild R heel numbness noted    Cervical / Trunk Assessment Cervical / Trunk Assessment: Normal  Communication   Communication: No difficulties  Cognition Arousal/Alertness: Awake/alert Behavior During Therapy: WFL for tasks assessed/performed Overall Cognitive Status: Within Functional Limits for tasks assessed                                        General Comments  Nursing cleared pt for participation in physical therapy.  Pt agreeable to PT session.    Exercises Total Joint Exercises Ankle Circles/Pumps: AROM;Strengthening;Right;10 reps;Supine Quad Sets: AROM;Strengthening;Right;10 reps;Supine Short Arc Quad: AROM;Strengthening;Right;10 reps;Supine Heel Slides: AROM;Strengthening;Right;10 reps;Supine Hip ABduction/ADduction: AROM;Strengthening;Right;10 reps;Supine Straight Leg Raises: AROM;Strengthening;Right;10 reps;Supine Long Arc Quad: AROM;Strengthening;Right;10 reps;Seated Knee Flexion: AROM;Strengthening;Right;10 reps;Seated Goniometric ROM: R knee AROM 0-90 degrees   Assessment/Plan    PT  Assessment Patient needs continued PT services  PT Problem List Decreased strength;Decreased range of motion;Decreased activity tolerance;Decreased balance;Decreased mobility;Decreased knowledge of use of DME;Decreased knowledge of precautions;Pain;Decreased skin integrity       PT Treatment Interventions DME instruction;Gait training;Stair training;Functional mobility training;Therapeutic activities;Therapeutic exercise;Balance training;Patient/family education    PT Goals (Current goals can be found in the Care Plan section)  Acute Rehab PT Goals Patient Stated Goal: to go home PT Goal Formulation: With patient Time For Goal Achievement: 03/13/21 Potential to Achieve Goals: Good    Frequency BID   Barriers to discharge        Co-evaluation               AM-PAC PT "6 Clicks" Mobility  Outcome Measure Help needed turning from your back to your side while in a flat bed without using bedrails?: None Help needed moving from lying on your back to sitting on the side of a flat bed without using bedrails?: None Help needed moving to and from a bed to a chair (including a wheelchair)?: A Little Help needed standing up from a chair using your arms (e.g., wheelchair or bedside chair)?: A Little Help needed to walk in hospital room?: A Little Help needed climbing 3-5 steps with a railing? : A Little 6 Click Score: 20    End of Session Equipment Utilized During Treatment: Gait belt Activity Tolerance: Patient tolerated treatment well Patient left: in chair;with call bell/phone within reach;with family/visitor present;Other (comment) (polar care in place) Nurse Communication: Mobility status;Precautions;Weight bearing status PT Visit Diagnosis: Other abnormalities of gait and mobility (R26.89);Muscle weakness (  generalized) (M62.81);Pain Pain - Right/Left: Right Pain - part of body: Knee    Time: 0254-2706 PT Time Calculation (min) (ACUTE ONLY): 51 min   Charges:   PT  Evaluation $PT Eval Low Complexity: 1 Low PT Treatments $Gait Training: 8-22 mins $Therapeutic Exercise: 8-22 mins        Hendricks Limes, PT 02/27/21, 3:52 PM

## 2021-02-28 ENCOUNTER — Encounter: Payer: Self-pay | Admitting: Surgery

## 2021-03-04 NOTE — Anesthesia Postprocedure Evaluation (Signed)
Anesthesia Post Note  Patient: Austin Wilson  Procedure(s) Performed: TOTAL KNEE ARTHROPLASTY (Right: Knee)  Patient location during evaluation: PACU Anesthesia Type: Spinal Level of consciousness: awake and alert Pain management: pain level controlled Vital Signs Assessment: post-procedure vital signs reviewed and stable Respiratory status: spontaneous breathing, nonlabored ventilation, respiratory function stable and patient connected to nasal cannula oxygen Cardiovascular status: blood pressure returned to baseline and stable Postop Assessment: no apparent nausea or vomiting Anesthetic complications: no   No notable events documented.   Last Vitals:  Vitals:   02/27/21 1156 02/27/21 1436  BP: 134/78 (!) 160/86  Pulse: 74 83  Resp: 15 17  Temp: (!) 36.3 C   SpO2: 98% 97%    Last Pain:  Vitals:   02/27/21 1156  TempSrc: Temporal  PainSc: 0-No pain                 Yevette Edwards

## 2021-03-14 ENCOUNTER — Other Ambulatory Visit: Payer: Self-pay | Admitting: Urology

## 2021-03-14 DIAGNOSIS — E349 Endocrine disorder, unspecified: Secondary | ICD-10-CM

## 2021-04-04 ENCOUNTER — Other Ambulatory Visit: Payer: BC Managed Care – PPO

## 2021-04-04 ENCOUNTER — Other Ambulatory Visit: Payer: Self-pay

## 2021-04-04 DIAGNOSIS — E349 Endocrine disorder, unspecified: Secondary | ICD-10-CM

## 2021-04-04 DIAGNOSIS — E291 Testicular hypofunction: Secondary | ICD-10-CM

## 2021-04-05 LAB — TESTOSTERONE: Testosterone: 910 ng/dL (ref 264–916)

## 2021-04-08 ENCOUNTER — Telehealth: Payer: Self-pay

## 2021-04-08 DIAGNOSIS — E291 Testicular hypofunction: Secondary | ICD-10-CM

## 2021-04-08 NOTE — Telephone Encounter (Signed)
Patient notified and scheduled for lab work with PSA per Carollee Herter. Patient also due for a 59mo follow up visit, this was also scheduled and lab orders were placed

## 2021-04-08 NOTE — Telephone Encounter (Signed)
-----   Message from [name], PA-C sent at 04/07/2021  8:08 AM EST ----- Please let Austin Wilson know that his testosterone level is normal, but he did not get his hemoglobin and hematocrit drawn.  We need that blood work drawn.

## 2021-04-17 ENCOUNTER — Other Ambulatory Visit: Payer: Self-pay

## 2021-04-17 ENCOUNTER — Other Ambulatory Visit: Payer: BC Managed Care – PPO

## 2021-04-17 DIAGNOSIS — E291 Testicular hypofunction: Secondary | ICD-10-CM

## 2021-04-17 DIAGNOSIS — E349 Endocrine disorder, unspecified: Secondary | ICD-10-CM

## 2021-04-18 LAB — HEMOGLOBIN AND HEMATOCRIT, BLOOD
Hematocrit: 46 % (ref 37.5–51.0)
Hemoglobin: 15.6 g/dL (ref 13.0–17.7)

## 2021-04-18 LAB — PSA: Prostate Specific Ag, Serum: 0.5 ng/mL (ref 0.0–4.0)

## 2021-04-18 LAB — TESTOSTERONE: Testosterone: 212 ng/dL — ABNORMAL LOW (ref 264–916)

## 2021-04-28 NOTE — Progress Notes (Signed)
04/29/2021 3:33 PM   Austin Wilson 06-Jan-1966 [id number]  Referring provider: Gayland Curry, MD [address] [place],  [place] [address]  Chief Complaint  Patient presents with   Hypogonadism    Urological history: 1. Testosterone deficiency -testosterone level 212 in 04/2021 -H & H WNL in 04/2021 -managed with testosterone cypionate 200 MG/ML, 0.75 cc every 14 days  2. BPH with LU TS -PSA 0.5 in 04/2021 -I PSS 1/0  HPI: Austin Wilson is a 55 y.o. male who presents today for a 6 month follow up.  Patient denies any modifying or aggravating factors.  Patient denies any gross hematuria, dysuria or suprapubic/flank pain.  Patient denies any fevers, chills, nausea or vomiting.     IPSS     Row Name 04/29/21 1500         International Prostate Symptom Score   How often have you had the sensation of not emptying your bladder? Not at All     How often have you had to urinate less than every two hours? Not at All     How often have you found you stopped and started again several times when you urinated? Not at All     How often have you found it difficult to postpone urination? Not at All     How often have you had a weak urinary stream? Not at All     How often have you had to strain to start urination? Not at All     How many times did you typically get up at night to urinate? 1 Time     Total IPSS Score 1       Quality of Life due to urinary symptoms   If you were to spend the rest of your life with your urinary condition just the way it is now how would you feel about that? Delighted              Score:  1-7 Mild 8-19 Moderate 20-35 Severe   Patient still having spontaneous erections.  He denies any pain or curvature with erections.     SHIM     Row Name 04/29/21 1510         SHIM: Over the last 6 months:   How do you rate your confidence that you could get and keep an erection? Very High     When you had erections with sexual  stimulation, how often were your erections hard enough for penetration (entering your partner)? Almost Always or Always     During sexual intercourse, how often were you able to maintain your erection after you had penetrated (entered) your partner? Almost Always or Always     During sexual intercourse, how difficult was it to maintain your erection to completion of intercourse? Not Difficult     When you attempted sexual intercourse, how often was it satisfactory for you? Most Times (much more than half the time)       SHIM Total Score   SHIM 24              Score: 1-7 Severe ED 8-11 Moderate ED 12-16 Mild-Moderate ED 17-21 Mild ED 22-25 No ED    PMH: Past Medical History:  Diagnosis Date   Alcohol-induced acute pancreatitis 2018   Bronchitis    Chronic gouty arthritis    GERD (gastroesophageal reflux disease)    Gout    Hyperglycemia    Hypertension    Mixed hyperlipidemia  Pre-diabetes     Surgical History: Past Surgical History:  Procedure Laterality Date   APPENDECTOMY  2007   KNEE ARTHROSCOPY Right 1998   torn lateral meniscus, ACL   KNEE ARTHROSCOPY Left 1996   torn ACL, lateral meniscus   TOTAL KNEE ARTHROPLASTY Left 10/04/2017   TOTAL KNEE ARTHROPLASTY Right 02/27/2021   Procedure: TOTAL KNEE ARTHROPLASTY;  Surgeon: Christena Flake, MD;  Location: [facility] ORS;  Service: Orthopedics;  Laterality: Right;    Home Medications:  Allergies as of 04/29/2021       Reactions   Pravastatin Other (See Comments)   Stomach pains        Medication List        Accurate as of April 29, 2021  3:33 PM. If you have any questions, ask your nurse or doctor.          STOP taking these medications    apixaban 2.5 MG Tabs tablet Commonly known as: Eliquis Stopped by: Jourdon Zimmerle, PA-C   oxyCODONE 5 MG immediate release tablet Commonly known as: Roxicodone Stopped by: Michiel Cowboy, PA-C       TAKE these medications    allopurinol 300 MG  tablet Commonly known as: ZYLOPRIM Take 300 mg by mouth daily.   atorvastatin 80 MG tablet Commonly known as: LIPITOR Take 80 mg by mouth every evening.   B-D 3CC LUER-LOK SYR 21GX1-1/2 21G X 1-1/2" 3 ML Misc Generic drug: SYRINGE-NEEDLE (DISP) 3 ML Use this needle to injection   BD SafetyGlide Needle 18G X 1-1/2" Misc Generic drug: NEEDLE (DISP) 18 G Use this to pull up medication   Clear Eyes for Dry Eyes 1-0.25 % Soln Generic drug: Carboxymethylcellul-Glycerin Place 1 drop into both eyes daily as needed (dry eyes).   lisinopril 20 MG tablet Commonly known as: ZESTRIL Take 20 mg by mouth in the morning and at bedtime.   metFORMIN 500 MG 24 hr tablet Commonly known as: GLUCOPHAGE-XR Take 500 mg by mouth at bedtime.   multivitamin with minerals tablet Take 1 tablet by mouth daily.   testosterone cypionate 200 MG/ML injection Commonly known as: DEPOTESTOSTERONE CYPIONATE Inject 0. 5 cc every week What changed:  how much to take how to take this when to take this additional instructions Changed by: Ehab Humber, PA-C   verapamil 360 MG 24 hr capsule Commonly known as: VERELAN PM Take 360 mg by mouth daily.        Allergies:  Allergies  Allergen Reactions   Pravastatin Other (See Comments)    Stomach pains    Family History: Family History  Problem Relation Age of Onset   Breast cancer Mother    Heart disease Father    Brain cancer Paternal Grandmother     Social History:  reports that he has never smoked. His smokeless tobacco use includes snuff. He reports current alcohol use of about 2.0 standard drinks per week. He reports that he does not use drugs.  ROS: Pertinent ROS in HPI  Physical Exam: BP (!) 178/82   Pulse 77   Ht 5\' 7"  (1.702 m)   Wt 256 lb (116.1 kg)   BMI 40.10 kg/m   Constitutional:  Well nourished. Alert and oriented, No acute distress. HEENT:  AT, mask in place.  Trachea midline Cardiovascular: No clubbing, cyanosis, or  edema. Respiratory: Normal respiratory effort, no increased work of breathing. GU: No CVA tenderness.  No bladder fullness or masses.  Patient with uncircumcised phallus.  Foreskin easily retracted  Urethral meatus  is patent.  No penile discharge. No penile lesions or rashes. Scrotum without lesions, cysts, rashes and/or edema.  Testicles are located scrotally bilaterally. No masses are appreciated in the testicles. Left and right epididymis are normal. Rectal: Patient with  normal sphincter tone. Anus and perineum without scarring or rashes. No rectal masses are appreciated. Prostate is approximately 45 grams, no nodules are appreciated. Seminal vesicles palpated Neurologic: Grossly intact, no focal deficits, moving all 4 extremities. Psychiatric: Normal mood and affect.  Laboratory Data: Lab Results  Component Value Date   WBC 10.2 02/24/2021   HGB 15.6 04/17/2021   HCT 46.0 04/17/2021   MCV 93.4 02/24/2021   PLT 201 02/24/2021    Lab Results  Component Value Date   CREATININE 0.80 02/24/2021    Lab Results  Component Value Date   TESTOSTERONE 212 (L) 04/17/2021   Lab Results  Component Value Date   AST 24 02/24/2021   Lab Results  Component Value Date   ALT 28 02/24/2021    Urinalysis    Component Value Date/Time   COLORURINE YELLOW (A) 02/24/2021 1357   APPEARANCEUR CLEAR (A) 02/24/2021 1357   LABSPEC 1.025 02/24/2021 1357   PHURINE 5.0 02/24/2021 1357   GLUCOSEU NEGATIVE 02/24/2021 1357   [place] 02/24/2021 1357   [place] 02/24/2021 1357   [place] 02/24/2021 [place] 02/24/2021 1357   NITRITE NEGATIVE 02/24/2021 1357   LEUKOCYTESUR NEGATIVE 02/24/2021 1357  I have reviewed the labs.   Pertinent Imaging: N/A   Assessment & Plan:    1. Testosterone deficiency -testosterone level not therapeutic -We will increase his testosterone cypionate to 0.5 weekly and then recheck his testosterone level 3 days after his  fourth injection -refill sent to pharmacy   2. BPH with LUTS -PSA stable -DRE benign --continue conservative management, avoiding bladder irritants and timed voiding's   Return for 3 days after fourth injection for testosterone level.  These notes generated with voice recognition software. I apologize for typographical errors.  Zara Council, PA-C  [facility] [address]  [place], [place] [address] [phone]

## 2021-04-29 ENCOUNTER — Ambulatory Visit (INDEPENDENT_AMBULATORY_CARE_PROVIDER_SITE_OTHER): Payer: BC Managed Care – PPO | Admitting: Urology

## 2021-04-29 ENCOUNTER — Other Ambulatory Visit: Payer: Self-pay

## 2021-04-29 ENCOUNTER — Encounter: Payer: Self-pay | Admitting: Urology

## 2021-04-29 VITALS — BP 178/82 | HR 77 | Ht 67.0 in | Wt 256.0 lb

## 2021-04-29 DIAGNOSIS — E349 Endocrine disorder, unspecified: Secondary | ICD-10-CM | POA: Diagnosis not present

## 2021-04-29 DIAGNOSIS — N401 Enlarged prostate with lower urinary tract symptoms: Secondary | ICD-10-CM

## 2021-04-29 DIAGNOSIS — N138 Other obstructive and reflux uropathy: Secondary | ICD-10-CM

## 2021-04-29 MED ORDER — TESTOSTERONE CYPIONATE 200 MG/ML IM SOLN: INTRAMUSCULAR | 0 refills | Status: DC

## 2021-05-20 ENCOUNTER — Telehealth: Payer: Self-pay | Admitting: Urology

## 2021-05-20 NOTE — Telephone Encounter (Signed)
Pt said Austin Wilson wanted him to repeat his labs, but I did not see any orders in and he would like for someone to give him a call today and let him know if an appt will be scheduled this week.

## 2021-05-20 NOTE — Telephone Encounter (Signed)
Spoke with patient and he will come to [place] to get labs drawn, order was put in 04/29/2021.

## 2021-05-21 ENCOUNTER — Other Ambulatory Visit
Admission: RE | Admit: 2021-05-21 | Discharge: 2021-05-21 | Disposition: A | Discharge status: Home / Self Care | Payer: BC Managed Care – PPO | Attending: Urology | Admitting: Urology

## 2021-05-21 DIAGNOSIS — E349 Endocrine disorder, unspecified: Secondary | ICD-10-CM | POA: Insufficient documentation

## 2021-05-22 LAB — TESTOSTERONE: Testosterone: 662 ng/dL (ref 264–916)

## 2021-10-03 ENCOUNTER — Other Ambulatory Visit: Payer: Self-pay | Admitting: Urology

## 2021-10-03 DIAGNOSIS — E349 Endocrine disorder, unspecified: Secondary | ICD-10-CM

## 2021-10-05 ENCOUNTER — Telehealth: Payer: Self-pay | Admitting: Urology

## 2021-10-05 NOTE — Telephone Encounter (Signed)
Mr. Rorie will need an office visit sometime in June for PSA, testosterone, H&H, I PSS, SHIM and exam with blood work prior.

## 2021-10-07 NOTE — Telephone Encounter (Signed)
Sent pt Mychart msg

## 2021-10-09 ENCOUNTER — Other Ambulatory Visit: Payer: Self-pay | Admitting: Urology

## 2021-10-09 DIAGNOSIS — E349 Endocrine disorder, unspecified: Secondary | ICD-10-CM

## 2021-10-30 ENCOUNTER — Other Ambulatory Visit: Payer: Self-pay

## 2021-10-30 DIAGNOSIS — N138 Other obstructive and reflux uropathy: Secondary | ICD-10-CM

## 2021-10-30 DIAGNOSIS — E349 Endocrine disorder, unspecified: Secondary | ICD-10-CM

## 2021-10-31 ENCOUNTER — Other Ambulatory Visit: Payer: BC Managed Care – PPO

## 2021-10-31 DIAGNOSIS — N138 Other obstructive and reflux uropathy: Secondary | ICD-10-CM

## 2021-10-31 DIAGNOSIS — E349 Endocrine disorder, unspecified: Secondary | ICD-10-CM

## 2021-11-01 LAB — TESTOSTERONE: Testosterone: 831 ng/dL (ref 264–916)

## 2021-11-01 LAB — PSA: Prostate Specific Ag, Serum: 0.3 ng/mL (ref 0.0–4.0)

## 2021-11-07 ENCOUNTER — Other Ambulatory Visit: Payer: Self-pay

## 2021-11-07 ENCOUNTER — Encounter: Payer: Self-pay | Admitting: Urology

## 2021-11-07 ENCOUNTER — Ambulatory Visit: Payer: BC Managed Care – PPO | Admitting: Urology

## 2021-11-07 VITALS — BP 191/84 | HR 84 | Ht 67.0 in | Wt 262.0 lb

## 2021-11-07 DIAGNOSIS — N401 Enlarged prostate with lower urinary tract symptoms: Secondary | ICD-10-CM | POA: Diagnosis not present

## 2021-11-07 DIAGNOSIS — E291 Testicular hypofunction: Secondary | ICD-10-CM

## 2021-11-07 DIAGNOSIS — E349 Endocrine disorder, unspecified: Secondary | ICD-10-CM

## 2021-11-07 DIAGNOSIS — N529 Male erectile dysfunction, unspecified: Secondary | ICD-10-CM

## 2021-11-07 DIAGNOSIS — N138 Other obstructive and reflux uropathy: Secondary | ICD-10-CM

## 2021-11-07 MED ORDER — "BD SAFETYGLIDE NEEDLE 18G X 1-1/2"" MISC": 0 refills | Status: AC

## 2021-11-07 MED ORDER — "BD LUER-LOK SYRINGE 21G X 1-1/2"" 3 ML MISC": 0 refills | Status: AC

## 2021-11-07 MED ORDER — TESTOSTERONE CYPIONATE 200 MG/ML IM SOLN: INTRAMUSCULAR | 0 refills | Status: DC

## 2021-11-08 LAB — HEMOGLOBIN AND HEMATOCRIT, BLOOD
Hematocrit: 49.5 % (ref 37.5–51.0)
Hemoglobin: 16.8 g/dL (ref 13.0–17.7)

## 2021-11-10 ENCOUNTER — Telehealth: Payer: Self-pay

## 2021-11-10 NOTE — Telephone Encounter (Signed)
Sent pt Mychart msg, see separate encounter.

## 2022-02-13 ENCOUNTER — Encounter: Payer: Self-pay | Admitting: Urology

## 2022-03-09 ENCOUNTER — Other Ambulatory Visit: Payer: Self-pay | Admitting: Urology

## 2022-03-09 DIAGNOSIS — E349 Endocrine disorder, unspecified: Secondary | ICD-10-CM

## 2022-03-09 MED ORDER — TESTOSTERONE CYPIONATE 200 MG/ML IM SOLN: INTRAMUSCULAR | 0 refills | Status: DC

## 2022-05-07 ENCOUNTER — Other Ambulatory Visit: Payer: BC Managed Care – PPO

## 2022-05-07 DIAGNOSIS — N138 Other obstructive and reflux uropathy: Secondary | ICD-10-CM

## 2022-05-07 DIAGNOSIS — E349 Endocrine disorder, unspecified: Secondary | ICD-10-CM

## 2022-05-07 DIAGNOSIS — E291 Testicular hypofunction: Secondary | ICD-10-CM

## 2022-05-07 DIAGNOSIS — N401 Enlarged prostate with lower urinary tract symptoms: Secondary | ICD-10-CM

## 2022-05-08 ENCOUNTER — Other Ambulatory Visit: Payer: Self-pay | Admitting: Urology

## 2022-05-08 DIAGNOSIS — E349 Endocrine disorder, unspecified: Secondary | ICD-10-CM

## 2022-05-08 LAB — HEMOGLOBIN AND HEMATOCRIT, BLOOD
Hematocrit: 47.8 % (ref 37.5–51.0)
Hemoglobin: 16.6 g/dL (ref 13.0–17.7)

## 2022-05-08 LAB — TESTOSTERONE: Testosterone: 788 ng/dL (ref 264–916)

## 2022-05-08 LAB — PSA: Prostate Specific Ag, Serum: 0.3 ng/mL (ref 0.0–4.0)

## 2022-05-13 ENCOUNTER — Ambulatory Visit: Payer: BC Managed Care – PPO | Admitting: Urology

## 2022-05-20 NOTE — Progress Notes (Deleted)
11/07/2021 8:52 AM   Austin Wilson 05/07/1966 [id number]  Referring provider: Gayland Curry, MD [address] [place],  [place] [address]  Urological history: 1. Testosterone deficiency -Contributing factors of age, sleep apnea, alcohol and hepatic steatosis  -testosterone (04/2022) 788 -Hemoglobin/hematocrit (04/2022) 16.6/47.8 -testosterone cypionate 200 MG/ML, 0.5 cc every 7 days  2. BPH with LU TS -PSA (04/2022) 0.3 -I PSS ***  3. ED -Contributing factors of age, BPH, testosterone deficiency, hypertension, sleep apnea, alcohol, hyperlipidemia and diabetes -SHIM ***  Chief Complaint  Patient presents with   Testosterone deficiency    HPI: Austin Wilson is a 57 y.o. male who presents today for a 6 month follow up.  I PSS ***   IPSS     Row Name 11/07/21 0800         International Prostate Symptom Score   How often have you had the sensation of not emptying your bladder? Not at All     How often have you had to urinate less than every two hours? Not at All     How often have you found you stopped and started again several times when you urinated? Not at All     How often have you found it difficult to postpone urination? Not at All     How often have you had a weak urinary stream? Not at All     How often have you had to strain to start urination? Not at All     How many times did you typically get up at night to urinate? 1 Time     Total IPSS Score 1       Quality of Life due to urinary symptoms   If you were to spend the rest of your life with your urinary condition just the way it is now how would you feel about that? Delighted               Score:  1-7 Mild 8-19 Moderate 20-35 Severe   SHIM ***   SHIM     Row Name 11/07/21 0835         SHIM: Over the last 6 months:   How do you rate your confidence that you could get and keep an erection? High     When you had erections with sexual stimulation, how often were your  erections hard enough for penetration (entering your partner)? Most Times (much more than half the time)     During sexual intercourse, how often were you able to maintain your erection after you had penetrated (entered) your partner? Most Times (much more than half the time)     During sexual intercourse, how difficult was it to maintain your erection to completion of intercourse? Not Difficult     When you attempted sexual intercourse, how often was it satisfactory for you? Most Times (much more than half the time)       SHIM Total Score   SHIM 21               Score: 1-7 Severe ED 8-11 Moderate ED 12-16 Mild-Moderate ED 17-21 Mild ED 22-25 No ED    PMH: Past Medical History:  Diagnosis Date   Alcohol-induced acute pancreatitis 2018   Bronchitis    Chronic gouty arthritis    GERD (gastroesophageal reflux disease)    Gout    Hyperglycemia    Hypertension    Mixed hyperlipidemia    Pre-diabetes     Surgical History:  Past Surgical History:  Procedure Laterality Date   APPENDECTOMY  2007   KNEE ARTHROSCOPY Right 1998   torn lateral meniscus, ACL   KNEE ARTHROSCOPY Left 1996   torn ACL, lateral meniscus   TOTAL KNEE ARTHROPLASTY Left 10/04/2017   TOTAL KNEE ARTHROPLASTY Right 02/27/2021   Procedure: TOTAL KNEE ARTHROPLASTY;  Surgeon: Corky Mull, MD;  Location: [facility] ORS;  Service: Orthopedics;  Laterality: Right;    Home Medications:  Allergies as of 11/07/2021       Reactions   Pravastatin Other (See Comments)   Stomach pains        Medication List        Accurate as of November 07, 2021  8:52 AM. If you have any questions, ask your nurse or doctor.          allopurinol 300 MG tablet Commonly known as: ZYLOPRIM Take 300 mg by mouth daily.   atorvastatin 80 MG tablet Commonly known as: LIPITOR Take 80 mg by mouth every evening.   B-D 3CC LUER-LOK SYR 21GX1-1/2 21G X 1-1/2" 3 ML Misc Generic drug: SYRINGE-NEEDLE (DISP) 3 ML Use this needle to  injection   BD SafetyGlide Needle 18G X 1-1/2" Misc Generic drug: NEEDLE (DISP) 18 G Use this to pull up medication   Clear Eyes for Dry Eyes 1-0.25 % Soln Generic drug: Carboxymethylcellul-Glycerin Place 1 drop into both eyes daily as needed (dry eyes).   lisinopril 20 MG tablet Commonly known as: ZESTRIL Take 20 mg by mouth in the morning and at bedtime.   metFORMIN 500 MG 24 hr tablet Commonly known as: GLUCOPHAGE-XR Take 500 mg by mouth at bedtime.   multivitamin with minerals tablet Take 1 tablet by mouth daily.   testosterone cypionate 200 MG/ML injection Commonly known as: DEPOTESTOSTERONE CYPIONATE Inject 0. 5 cc every week   traZODone 100 MG tablet Commonly known as: DESYREL Take 100 mg by mouth at bedtime.   verapamil 360 MG 24 hr capsule Commonly known as: VERELAN PM Take 360 mg by mouth daily.        Allergies:  Allergies  Allergen Reactions   Pravastatin Other (See Comments)    Stomach pains    Family History: Family History  Problem Relation Age of Onset   Breast cancer Mother    Heart disease Father    Brain cancer Paternal Grandmother     Social History:  reports that he has never smoked. His smokeless tobacco use includes snuff. He reports current alcohol use of about 2.0 standard drinks of alcohol per week. He reports that he does not use drugs.  ROS: Pertinent ROS in HPI  Physical Exam: BP (!) 191/84   Pulse 84   Ht 5\' 7"  (1.702 m)   Wt 262 lb (118.8 kg)   BMI 41.04 kg/m   Constitutional:  Well nourished. Alert and oriented, No acute distress. HEENT: [place] AT, moist mucus membranes.  Trachea midline Cardiovascular: No clubbing, cyanosis, or edema. Respiratory: Normal respiratory effort, no increased work of breathing. GU: No CVA tenderness.  No bladder fullness or masses.  Patient with circumcised/uncircumcised phallus. ***Foreskin easily retracted***  Urethral meatus is patent.  No penile discharge. No penile lesions or rashes.  Scrotum without lesions, cysts, rashes and/or edema.  Testicles are located scrotally bilaterally. No masses are appreciated in the testicles. Left and right epididymis are normal. Rectal: Patient with  normal sphincter tone. Anus and perineum without scarring or rashes. No rectal masses are appreciated. Prostate is approximately *** grams, ***  nodules are appreciated. Seminal vesicles are normal. Neurologic: Grossly intact, no focal deficits, moving all 4 extremities. Psychiatric: Normal mood and affect.   Laboratory Data: See urological history and epic I have reviewed the labs.   Pertinent Imaging: N/A  Assessment & Plan:    1. Testosterone deficiency  -testosterone levels are therapeutic -H & H are normal  -continue testosterone 200 mg/mL, 0.5 cc every 7 days  2. BPH with LUTS -PSA stable -continue conservative management, avoiding bladder irritants and timed voiding's  3. Erectile dysfunction:    - mild symptoms - continue to monitor  Return in about 6 months (around 05/09/2022) for PSA, testosterone (mid way between injection) H & H, IPSS, SHIM and exam.  These notes generated with voice recognition software. I apologize for typographical errors.  [name]  [facility] [address]  [address] [place], [place] [address] [phone]

## 2022-05-21 ENCOUNTER — Ambulatory Visit: Payer: BC Managed Care – PPO | Admitting: Urology

## 2022-05-25 IMAGING — DX DG KNEE 1-2V PORT*R*
2 series · 2 of 2 positions shown · non-contrast
Comparison: Prior knee radiographs are not available for review
status post

CLINICAL DATA: Status post total knee replacement

EXAM:
PORTABLE RIGHT KNEE - 1-2 VIEW

[knee ap]
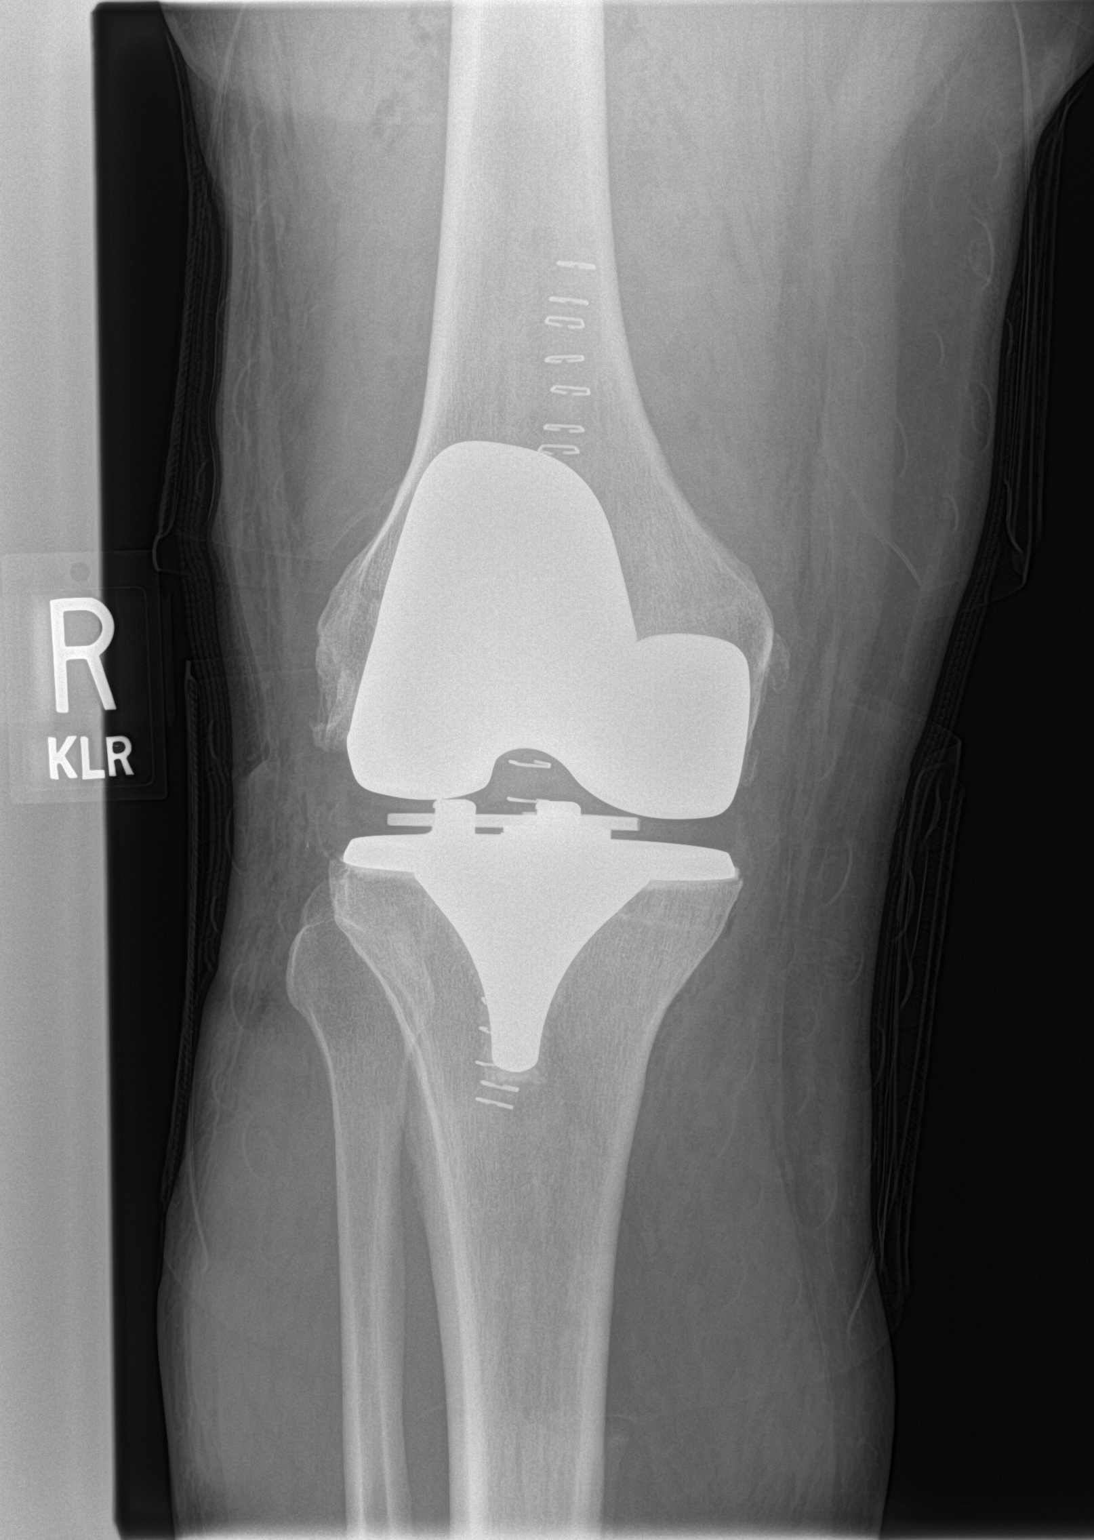

[knee lat]
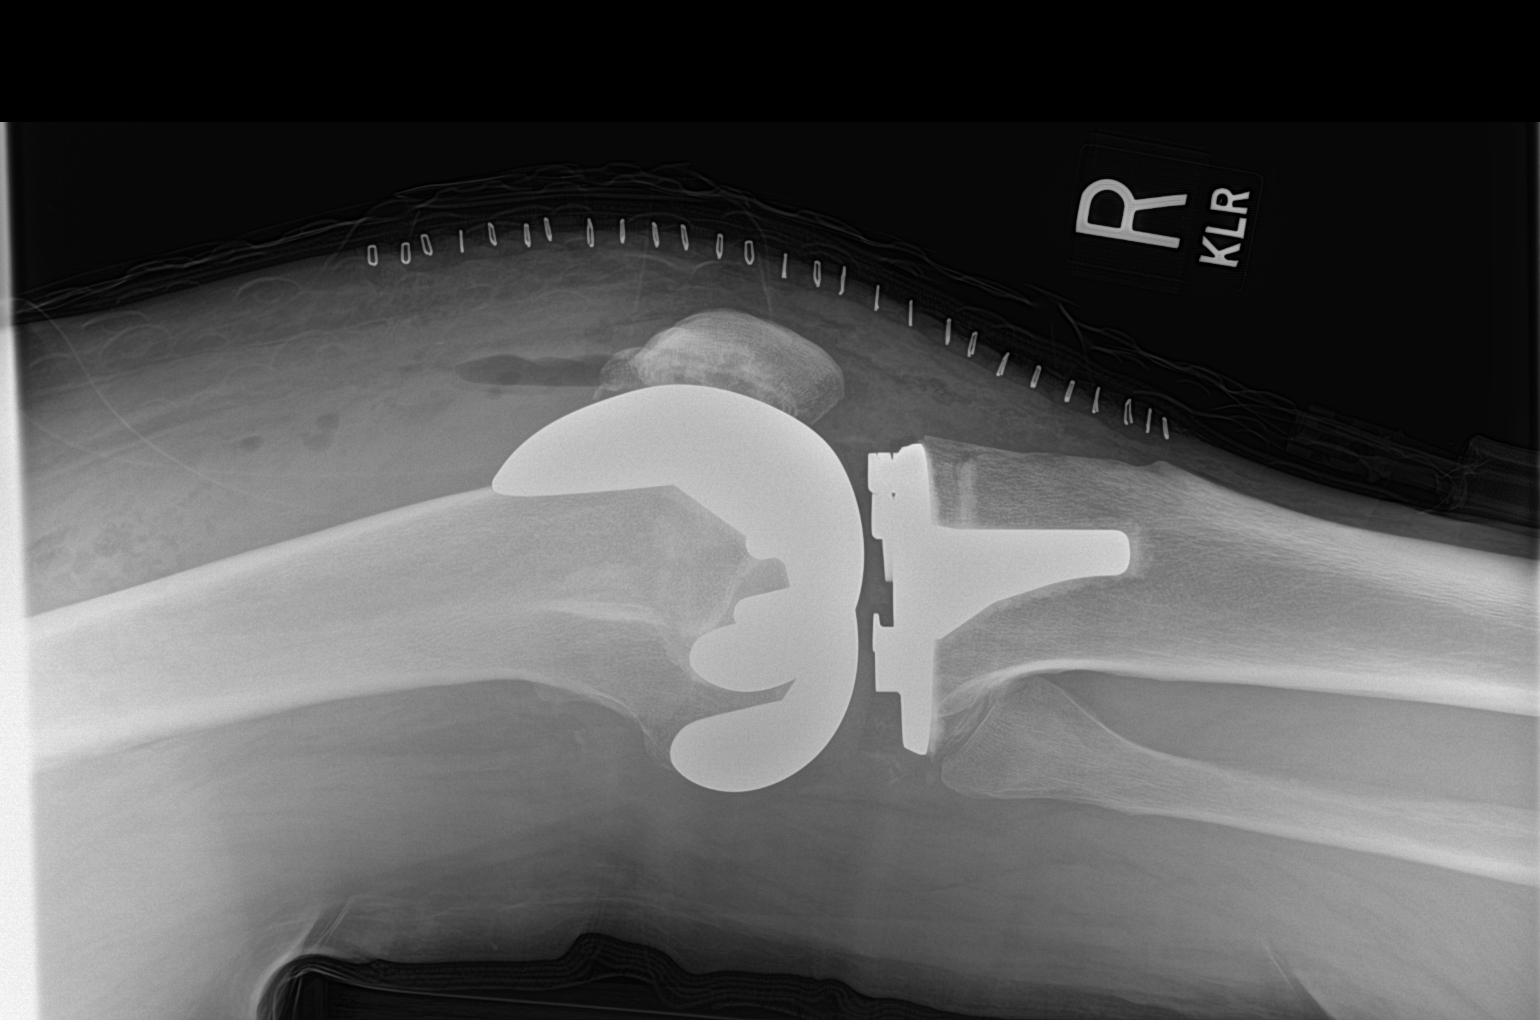

[2 of 2 positions shown; findings below may reference images not displayed]

FINDINGS: Total right knee arthroplasty. Prosthesis components appear to be in
near anatomic alignment. No perihardware lucency or fracture.
Subcutaneous air, not unexpected postoperatively. Superficial skin
staples.
IMPRESSION: Expected appearance, status post right total knee arthroplasty.

## 2022-06-02 NOTE — Progress Notes (Signed)
06/03/2022 9:14 AM   Austin Wilson [birth year] [id number]  Referring provider: Gayland Curry, MD [address] [place],  [place] [address]  Urological history: 1. Testosterone deficiency -Contributing factors of age, sleep apnea, alcohol and hepatic steatosis  -testosterone (04/2022) 788 -Hemoglobin/hematocrit (04/2022) 16.6/47.8 -testosterone cypionate 200 MG/ML, 0.5 cc every 7 days  2. BPH with LU TS -PSA (04/2022) 0.3 -I PSS 2/1  3. ED -Contributing factors of age, BPH, testosterone deficiency, hypertension, sleep apnea, alcohol, hyperlipidemia and diabetes -SHIM 23  Chief Complaint  Patient presents with   Follow-up    21mth follow-up testosterone     HPI: Austin Wilson is a 57 y.o. male who presents today for a 6 month follow up.  I PSS 2/1  He has no urinary complaints.  Patient denies any modifying or aggravating factors.  Patient denies any gross hematuria, dysuria or suprapubic/flank pain.  Patient denies any fevers, chills, nausea or vomiting.     IPSS     Row Name 06/03/22 0800         International Prostate Symptom Score   How often have you had the sensation of not emptying your bladder? Less than 1 in 5     How often have you had to urinate less than every two hours? Not at All     How often have you found you stopped and started again several times when you urinated? Not at All     How often have you found it difficult to postpone urination? Not at All     How often have you had a weak urinary stream? Not at All     How often have you had to strain to start urination? Not at All     How many times did you typically get up at night to urinate? 1 Time     Total IPSS Score 2       Quality of Life due to urinary symptoms   If you were to spend the rest of your life with your urinary condition just the way it is now how would you feel about that? Pleased                Score:  1-7 Mild 8-19 Moderate 20-35 Severe   SHIM  23  Patient still having spontaneous erections.  He denies any pain or curvature with erections.     SHIM     Row Name 06/03/22 0855         SHIM: Over the last 6 months:   How do you rate your confidence that you could get and keep an erection? High     When you had erections with sexual stimulation, how often were your erections hard enough for penetration (entering your partner)? Almost Always or Always     During sexual intercourse, how often were you able to maintain your erection after you had penetrated (entered) your partner? Almost Always or Always     During sexual intercourse, how difficult was it to maintain your erection to completion of intercourse? Not Difficult     When you attempted sexual intercourse, how often was it satisfactory for you? Most Times (much more than half the time)       SHIM Total Score   SHIM 23                Score: 1-7 Severe ED 8-11 Moderate ED 12-16 Mild-Moderate ED 17-21 Mild ED 22-25 No ED    PMH: Past  Medical History:  Diagnosis Date   Alcohol-induced acute pancreatitis 2018   Bronchitis    Chronic gouty arthritis    GERD (gastroesophageal reflux disease)    Gout    Hyperglycemia    Hypertension    Mixed hyperlipidemia    Pre-diabetes     Surgical History: Past Surgical History:  Procedure Laterality Date   APPENDECTOMY  2007   KNEE ARTHROSCOPY Right 1998   torn lateral meniscus, ACL   KNEE ARTHROSCOPY Left 1996   torn ACL, lateral meniscus   TOTAL KNEE ARTHROPLASTY Left 10/04/2017   TOTAL KNEE ARTHROPLASTY Right 02/27/2021   Procedure: TOTAL KNEE ARTHROPLASTY;  Surgeon: Christena Flake, MD;  Location: [facility] ORS;  Service: Orthopedics;  Laterality: Right;    Home Medications:  Allergies as of 06/03/2022       Reactions   Pravastatin Other (See Comments)   Stomach pains        Medication List        Accurate as of June 03, 2022  9:14 AM. If you have any questions, ask your nurse or doctor.           allopurinol 300 MG tablet Commonly known as: ZYLOPRIM Take 300 mg by mouth daily.   atorvastatin 80 MG tablet Commonly known as: LIPITOR Take 80 mg by mouth every evening.   B-D 3CC LUER-LOK SYR 21GX1-1/2 21G X 1-1/2" 3 ML Misc Generic drug: SYRINGE-NEEDLE (DISP) 3 ML Use this needle to injection   BD SafetyGlide Needle 18G X 1-1/2" Misc Generic drug: NEEDLE (DISP) 18 G Use this to pull up medication   Clear Eyes for Dry Eyes 1-0.25 % Soln Generic drug: Carboxymethylcellul-Glycerin Place 1 drop into both eyes daily as needed (dry eyes).   lisinopril 20 MG tablet Commonly known as: ZESTRIL Take 20 mg by mouth in the morning and at bedtime.   metFORMIN 500 MG 24 hr tablet Commonly known as: GLUCOPHAGE-XR Take 500 mg by mouth at bedtime.   multivitamin with minerals tablet Take 1 tablet by mouth daily.   testosterone cypionate 200 MG/ML injection Commonly known as: DEPOTESTOSTERONE CYPIONATE INJECT 0. 5 CC EVERY WEEK. DISCARD THE REMAINING   traZODone 100 MG tablet Commonly known as: DESYREL Take 100 mg by mouth at bedtime.   verapamil 360 MG 24 hr capsule Commonly known as: VERELAN PM Take 360 mg by mouth daily.        Allergies:  Allergies  Allergen Reactions   Pravastatin Other (See Comments)    Stomach pains    Family History: Family History  Problem Relation Age of Onset   Breast cancer Mother    Heart disease Father    Brain cancer Paternal Grandmother     Social History:  reports that he has never smoked. He has never been exposed to tobacco smoke. His smokeless tobacco use includes snuff. He reports current alcohol use of about 2.0 standard drinks of alcohol per week. He reports that he does not use drugs.  ROS: Pertinent ROS in HPI  Physical Exam: BP (!) 183/102   Pulse 70   Ht 5\' 7"  (1.702 m)   Wt 260 lb (117.9 kg)   BMI 40.72 kg/m   Constitutional:  Well nourished. Alert and oriented, No acute distress. HEENT: [place] AT, moist mucus  membranes.  Trachea midline Cardiovascular: No clubbing, cyanosis, or edema. Respiratory: Normal respiratory effort, no increased work of breathing. GU: No CVA tenderness.  No bladder fullness or masses.  Patient with uncircumcised phallus. Foreskin easily  retracted  Urethral meatus is patent.  No penile discharge. No penile lesions or rashes. Scrotum without lesions, cysts, rashes and/or edema.  Testicles are located scrotally bilaterally. No masses are appreciated in the testicles. Left and right epididymis are normal. Rectal: Patient with  normal sphincter tone. Anus and perineum without scarring or rashes. No rectal masses are appreciated. Prostate is approximately 35 grams, no nodules are appreciated. Seminal vesicles could not be palpated Neurologic: Grossly intact, no focal deficits, moving all 4 extremities. Psychiatric: Normal mood and affect.   Laboratory Data: See urological history and epic I have reviewed the labs.   Pertinent Imaging: N/A  Assessment & Plan:    1. Testosterone deficiency  -testosterone levels are therapeutic -H & H are normal  -continue testosterone 200 mg/mL, 0.5 cc every 7 days  2. BPH with LUTS -PSA stable -continue conservative management, avoiding bladder irritants and timed voiding's  3. Erectile dysfunction:    - mild symptoms - continue to monitor   Return in about 6 months (around 12/02/2022) for testosterone (one week after injection) H & H - labs only .  These notes generated with voice recognition software. I apologize for typographical errors.  [place], [place] [address]  [place],  [address] [phone]

## 2022-06-03 ENCOUNTER — Ambulatory Visit: Payer: BC Managed Care – PPO | Admitting: Urology

## 2022-06-03 ENCOUNTER — Encounter: Payer: Self-pay | Admitting: Urology

## 2022-06-03 VITALS — BP 183/102 | HR 70 | Ht 67.0 in | Wt 260.0 lb

## 2022-06-03 DIAGNOSIS — N529 Male erectile dysfunction, unspecified: Secondary | ICD-10-CM

## 2022-06-03 DIAGNOSIS — E291 Testicular hypofunction: Secondary | ICD-10-CM | POA: Diagnosis not present

## 2022-06-03 DIAGNOSIS — N138 Other obstructive and reflux uropathy: Secondary | ICD-10-CM | POA: Diagnosis not present

## 2022-06-03 DIAGNOSIS — E349 Endocrine disorder, unspecified: Secondary | ICD-10-CM

## 2022-06-03 DIAGNOSIS — N401 Enlarged prostate with lower urinary tract symptoms: Secondary | ICD-10-CM | POA: Diagnosis not present

## 2022-12-02 ENCOUNTER — Other Ambulatory Visit: Payer: BC Managed Care – PPO

## 2022-12-02 DIAGNOSIS — E349 Endocrine disorder, unspecified: Secondary | ICD-10-CM

## 2022-12-02 DIAGNOSIS — N401 Enlarged prostate with lower urinary tract symptoms: Secondary | ICD-10-CM

## 2022-12-02 DIAGNOSIS — N529 Male erectile dysfunction, unspecified: Secondary | ICD-10-CM

## 2022-12-03 LAB — TESTOSTERONE: Testosterone: 668 ng/dL (ref 264–916)

## 2022-12-03 LAB — HEMOGLOBIN AND HEMATOCRIT, BLOOD
Hematocrit: 41.5 % (ref 37.5–51.0)
Hemoglobin: 14.2 g/dL (ref 13.0–17.7)

## 2022-12-09 ENCOUNTER — Other Ambulatory Visit: Payer: Self-pay | Admitting: Urology

## 2022-12-09 DIAGNOSIS — E349 Endocrine disorder, unspecified: Secondary | ICD-10-CM

## 2023-01-28 ENCOUNTER — Other Ambulatory Visit: Payer: Self-pay | Admitting: *Deleted

## 2023-01-28 DIAGNOSIS — E349 Endocrine disorder, unspecified: Secondary | ICD-10-CM

## 2023-01-29 ENCOUNTER — Other Ambulatory Visit: Payer: Self-pay | Admitting: *Deleted

## 2023-01-29 DIAGNOSIS — E349 Endocrine disorder, unspecified: Secondary | ICD-10-CM

## 2023-01-29 MED ORDER — TESTOSTERONE CYPIONATE 200 MG/ML IM SOLN: 100.0000 mg | INTRAMUSCULAR | 0 refills | Status: DC

## 2023-01-29 NOTE — Telephone Encounter (Signed)
Pt calling upset that his refill for testosterone was denied. I advised pt that per shannon results he need to schedule lab appt and OV. Pt stated that's "6 mth from now and I don't know what I'll be doing" I explained that in order to received his prescriptions he needs to follow-up per insurance parameters. Pt also complaining of the 10 ml vial only last for 28 days and that he's wasting medication. Pt asking to return back to 1 ml vials.   Refill pended, appt for lab and OV scheduled   "Thayer Ohm, Your labs look good.  I will need to see you in 6 months for repeat labs.  Those labs will need to be a PSA, testosterone, hemoglobin and hematocrit.  It will also be time for an exam.  Please call the office to schedule your appointment. Regards, SHANNON MCGOWAN, PA-C"

## 2023-02-26 ENCOUNTER — Telehealth: Payer: Self-pay

## 2023-02-26 NOTE — Telephone Encounter (Signed)
PA for testosterone was sent to [facility] via covermymeds. Waiting on response.

## 2023-05-17 ENCOUNTER — Other Ambulatory Visit: Payer: Self-pay | Admitting: Family Medicine

## 2023-05-17 DIAGNOSIS — I10 Essential (primary) hypertension: Secondary | ICD-10-CM

## 2023-05-17 DIAGNOSIS — Z8249 Family history of ischemic heart disease and other diseases of the circulatory system: Secondary | ICD-10-CM

## 2023-05-18 ENCOUNTER — Other Ambulatory Visit: Payer: Self-pay | Admitting: Physician Assistant

## 2023-05-18 DIAGNOSIS — E349 Endocrine disorder, unspecified: Secondary | ICD-10-CM

## 2023-05-18 MED ORDER — TESTOSTERONE CYPIONATE 200 MG/ML IM SOLN: 100.0000 mg | INTRAMUSCULAR | 0 refills | Status: AC

## 2023-05-25 ENCOUNTER — Ambulatory Visit
Admission: RE | Admit: 2023-05-25 | Discharge: 2023-05-25 | Disposition: A | Discharge status: Home / Self Care | Payer: Self-pay | Source: Ambulatory Visit | Attending: Family Medicine | Admitting: Family Medicine

## 2023-05-25 DIAGNOSIS — Z8249 Family history of ischemic heart disease and other diseases of the circulatory system: Secondary | ICD-10-CM | POA: Insufficient documentation

## 2023-05-25 DIAGNOSIS — I10 Essential (primary) hypertension: Secondary | ICD-10-CM | POA: Insufficient documentation

## 2023-05-31 ENCOUNTER — Other Ambulatory Visit: Payer: Self-pay

## 2023-05-31 DIAGNOSIS — N138 Other obstructive and reflux uropathy: Secondary | ICD-10-CM

## 2023-05-31 DIAGNOSIS — E349 Endocrine disorder, unspecified: Secondary | ICD-10-CM

## 2023-06-01 ENCOUNTER — Other Ambulatory Visit: Payer: BC Managed Care – PPO

## 2023-06-01 DIAGNOSIS — E349 Endocrine disorder, unspecified: Secondary | ICD-10-CM

## 2023-06-01 DIAGNOSIS — N138 Other obstructive and reflux uropathy: Secondary | ICD-10-CM

## 2023-06-01 NOTE — Progress Notes (Deleted)
06/04/2023 12:59 PM   Austin Wilson [birth year] [id number]  Referring provider: Leim Fabry, MD [address] [place],  [place] [address]  Urological history: 1.  Hypogonadism -Contributing factors of age, sleep apnea, alcohol consumption and hepatic steatosis  -testosterone (05/2023) *** -Hemoglobin/hematocrit (05/2023) *** -testosterone cypionate 200 MG/ML, 0.5 cc every 7 days  2. BPH with LU TS -PSA (05/2023) ***  3. ED -Contributing factors of age, BPH, testosterone deficiency, hypertension, sleep apnea, alcohol consumption,  hyperlipidemia and diabetes  No chief complaint on file.   HPI: Austin Wilson is a 58 y.o. male who presents today for a 6 month follow up.  Previous records reviewed.     I PSS ***        Score:  1-7 Mild 8-19 Moderate 20-35 Severe   SHIM ***        Score: 1-7 Severe ED 8-11 Moderate ED 12-16 Mild-Moderate ED 17-21 Mild ED 22-25 No ED    PMH: Past Medical History:  Diagnosis Date   Alcohol-induced acute pancreatitis 2018   Bronchitis    Chronic gouty arthritis    GERD (gastroesophageal reflux disease)    Gout    Hyperglycemia    Hypertension    Mixed hyperlipidemia    Pre-diabetes     Surgical History: Past Surgical History:  Procedure Laterality Date   APPENDECTOMY  2007   KNEE ARTHROSCOPY Right 1998   torn lateral meniscus, ACL   KNEE ARTHROSCOPY Left 1996   torn ACL, lateral meniscus   TOTAL KNEE ARTHROPLASTY Left 10/04/2017   TOTAL KNEE ARTHROPLASTY Right 02/27/2021   Procedure: TOTAL KNEE ARTHROPLASTY;  Surgeon: Christena Flake, MD;  Location: [facility] ORS;  Service: Orthopedics;  Laterality: Right;    Home Medications:  Allergies as of 06/04/2023       Reactions   Pravastatin Other (See Comments)   Stomach pains        Medication List        Accurate as of June 01, 2023 12:59 PM. If you have any questions, ask your nurse or doctor.          allopurinol 300 MG  tablet Commonly known as: ZYLOPRIM Take 300 mg by mouth daily.   atorvastatin 80 MG tablet Commonly known as: LIPITOR Take 80 mg by mouth every evening.   B-D 3CC LUER-LOK SYR 21GX1-1/2 21G X 1-1/2" 3 ML Misc Generic drug: SYRINGE-NEEDLE (DISP) 3 ML Use this needle to injection   BD SafetyGlide Needle 18G X 1-1/2" Misc Generic drug: NEEDLE (DISP) 18 G Use this to pull up medication   Clear Eyes for Dry Eyes 1-0.25 % Soln Generic drug: Carboxymethylcellul-Glycerin Place 1 drop into both eyes daily as needed (dry eyes).   lisinopril 20 MG tablet Commonly known as: ZESTRIL Take 20 mg by mouth in the morning and at bedtime.   metFORMIN 500 MG 24 hr tablet Commonly known as: GLUCOPHAGE-XR Take 500 mg by mouth at bedtime.   multivitamin with minerals tablet Take 1 tablet by mouth daily.   testosterone cypionate 200 MG/ML injection Commonly known as: DEPOTESTOSTERONE CYPIONATE Inject 0.5 mLs (100 mg total) into the muscle once a week.   traZODone 100 MG tablet Commonly known as: DESYREL Take 100 mg by mouth at bedtime.   verapamil 360 MG 24 hr capsule Commonly known as: VERELAN Take 360 mg by mouth daily.        Allergies:  Allergies  Allergen Reactions   Pravastatin Other (See Comments)    Stomach  pains    Family History: Family History  Problem Relation Age of Onset   Breast cancer Mother    Heart disease Father    Brain cancer Paternal Grandmother     Social History:  reports that he has never smoked. He has never been exposed to tobacco smoke. His smokeless tobacco use includes snuff. He reports current alcohol use of about 2.0 standard drinks of alcohol per week. He reports that he does not use drugs.  ROS: Pertinent ROS in HPI  Physical Exam: There were no vitals taken for this visit.  Constitutional:  Well nourished. Alert and oriented, No acute distress. HEENT: [place] AT, moist mucus membranes.  Trachea midline, no masses. Cardiovascular: No  clubbing, cyanosis, or edema. Respiratory: Normal respiratory effort, no increased work of breathing. GI: Abdomen is soft, non tender, non distended, no abdominal masses. Liver and spleen not palpable.  No hernias appreciated.  Stool sample for occult testing is not indicated.   GU: No CVA tenderness.  No bladder fullness or masses.  Patient with circumcised/uncircumcised phallus. ***Foreskin easily retracted***  Urethral meatus is patent.  No penile discharge. No penile lesions or rashes. Scrotum without lesions, cysts, rashes and/or edema.  Testicles are located scrotally bilaterally. No masses are appreciated in the testicles. Left and right epididymis are normal. Rectal: Patient with  normal sphincter tone. Anus and perineum without scarring or rashes. No rectal masses are appreciated. Prostate is approximately *** grams, *** nodules are appreciated. Seminal vesicles are normal. Skin: No rashes, bruises or suspicious lesions. Lymph: No cervical or inguinal adenopathy. Neurologic: Grossly intact, no focal deficits, moving all 4 extremities. Psychiatric: Normal mood and affect.   Laboratory Data: *** I have reviewed the labs.   Pertinent Imaging: N/A  Assessment & Plan:    1. Hypogonadism -testosterone levels *** -Hemoglobin/hematocrit levels *** -continue testosterone 200 mg/mL, 0.5 cc every 7 days  2. BPH with LUTS -PSA *** -continue conservative management, avoiding bladder irritants and timed voiding's  3. Erectile dysfunction:    - mild symptoms - continue to monitor   No follow-ups on file.  These notes generated with voice recognition software. I apologize for typographical errors.  Austin Wilson  [facility] [address]  [address] [place], [place] [address] [phone]

## 2023-06-02 LAB — HEMOGLOBIN AND HEMATOCRIT, BLOOD
Hematocrit: 45.8 % (ref 37.5–51.0)
Hemoglobin: 15.1 g/dL (ref 13.0–17.7)

## 2023-06-02 LAB — TESTOSTERONE: Testosterone: 778 ng/dL (ref 264–916)

## 2023-06-02 LAB — PSA: Prostate Specific Ag, Serum: 0.4 ng/mL (ref 0.0–4.0)

## 2023-06-04 ENCOUNTER — Ambulatory Visit: Payer: BC Managed Care – PPO | Admitting: Urology

## 2023-06-04 DIAGNOSIS — N529 Male erectile dysfunction, unspecified: Secondary | ICD-10-CM

## 2023-06-04 DIAGNOSIS — N138 Other obstructive and reflux uropathy: Secondary | ICD-10-CM

## 2023-06-04 DIAGNOSIS — E291 Testicular hypofunction: Secondary | ICD-10-CM

## 2023-06-18 ENCOUNTER — Ambulatory Visit: Payer: BC Managed Care – PPO | Admitting: Urology

## 2024-06-13 ENCOUNTER — Other Ambulatory Visit: Payer: Self-pay | Admitting: Student

## 2024-06-13 DIAGNOSIS — M19011 Primary osteoarthritis, right shoulder: Secondary | ICD-10-CM

## 2024-06-13 DIAGNOSIS — M7581 Other shoulder lesions, right shoulder: Secondary | ICD-10-CM

## 2024-06-13 DIAGNOSIS — G8929 Other chronic pain: Secondary | ICD-10-CM

## 2024-06-16 ENCOUNTER — Ambulatory Visit
Admission: RE | Admit: 2024-06-16 | Discharge: 2024-06-16 | Disposition: A | Source: Ambulatory Visit | Attending: Student

## 2024-06-16 DIAGNOSIS — G8929 Other chronic pain: Secondary | ICD-10-CM | POA: Diagnosis present

## 2024-06-16 DIAGNOSIS — M7581 Other shoulder lesions, right shoulder: Secondary | ICD-10-CM | POA: Insufficient documentation

## 2024-06-16 DIAGNOSIS — M25511 Pain in right shoulder: Secondary | ICD-10-CM | POA: Diagnosis present

## 2024-06-16 DIAGNOSIS — M19011 Primary osteoarthritis, right shoulder: Secondary | ICD-10-CM | POA: Insufficient documentation
